# Patient Record
Sex: Male | Born: 1985 | Race: White | Hispanic: No | Marital: Married | Smoking: Never smoker
Health system: Southern US, Community
[De-identification: ages and names within clinical notes are randomized; demographics above are authoritative.]

## PROBLEM LIST (undated history)

## (undated) ENCOUNTER — Emergency Department (HOSPITAL_BASED_OUTPATIENT_CLINIC_OR_DEPARTMENT_OTHER): Admission: EM | Payer: BLUE CROSS/BLUE SHIELD | Source: Home / Self Care

## (undated) DIAGNOSIS — I10 Essential (primary) hypertension: Secondary | ICD-10-CM

## (undated) DIAGNOSIS — T783XXA Angioneurotic edema, initial encounter: Secondary | ICD-10-CM

## (undated) DIAGNOSIS — E559 Vitamin D deficiency, unspecified: Secondary | ICD-10-CM

## (undated) DIAGNOSIS — G4733 Obstructive sleep apnea (adult) (pediatric): Secondary | ICD-10-CM

## (undated) HISTORY — DX: Obstructive sleep apnea (adult) (pediatric): G47.33

## (undated) HISTORY — DX: Angioneurotic edema, initial encounter: T78.3XXA

## (undated) HISTORY — DX: Vitamin D deficiency, unspecified: E55.9

## (undated) HISTORY — DX: Essential (primary) hypertension: I10

---

## 2011-07-25 ENCOUNTER — Encounter (HOSPITAL_COMMUNITY): Payer: Self-pay

## 2011-07-25 ENCOUNTER — Emergency Department (HOSPITAL_COMMUNITY): Payer: 59

## 2011-07-25 ENCOUNTER — Emergency Department (HOSPITAL_COMMUNITY)
Admission: EM | Admit: 2011-07-25 | Discharge: 2011-07-25 | Disposition: A | Discharge status: Home / Self Care | Payer: 59 | Attending: Emergency Medicine | Admitting: Emergency Medicine

## 2011-07-25 DIAGNOSIS — R109 Unspecified abdominal pain: Secondary | ICD-10-CM | POA: Insufficient documentation

## 2011-07-25 DIAGNOSIS — R1110 Vomiting, unspecified: Secondary | ICD-10-CM | POA: Insufficient documentation

## 2011-07-25 DIAGNOSIS — N23 Unspecified renal colic: Secondary | ICD-10-CM

## 2011-07-25 LAB — URINALYSIS, ROUTINE W REFLEX MICROSCOPIC
Glucose, UA: NEGATIVE mg/dL
Leukocytes, UA: NEGATIVE
Protein, ur: 30 mg/dL — AB

## 2011-07-25 LAB — COMPREHENSIVE METABOLIC PANEL
Albumin: 4.5 g/dL (ref 3.5–5.2)
Alkaline Phosphatase: 48 U/L (ref 39–117)
BUN: 15 mg/dL (ref 6–23)
CO2: 26 mEq/L (ref 19–32)
Chloride: 105 mEq/L (ref 96–112)
GFR calc non Af Amer: 70 mL/min — ABNORMAL LOW (ref >90)
Potassium: 3.7 mEq/L (ref 3.5–5.1)
Total Bilirubin: 0.4 mg/dL (ref 0.3–1.2)

## 2011-07-25 LAB — CBC
Hemoglobin: 15.9 g/dL (ref 13.0–17.0)
MCHC: 35.2 g/dL (ref 30.0–36.0)
RBC: 5.51 MIL/uL (ref 4.22–5.81)
WBC: 15.1 10*3/uL — ABNORMAL HIGH (ref 4.0–10.5)

## 2011-07-25 LAB — DIFFERENTIAL
Basophils Relative: 0 % (ref 0–1)
Lymphocytes Relative: 13 % (ref 12–46)
Lymphs Abs: 2 10*3/uL (ref 0.7–4.0)
Monocytes Relative: 5 % (ref 3–12)
Neutro Abs: 12.4 10*3/uL — ABNORMAL HIGH (ref 1.7–7.7)
Neutrophils Relative %: 82 % — ABNORMAL HIGH (ref 43–77)

## 2011-07-25 LAB — POCT I-STAT, CHEM 8
BUN: 15 mg/dL (ref 6–23)
Calcium, Ion: 1.29 mmol/L (ref 1.12–1.32)
Creatinine, Ser: 1.5 mg/dL — ABNORMAL HIGH (ref 0.50–1.35)
TCO2: 27 mmol/L (ref 0–100)

## 2011-07-25 MED ORDER — OXYCODONE-ACETAMINOPHEN 5-325 MG PO TABS: 2.0000 | ORAL_TABLET | ORAL | Status: AC | PRN

## 2011-07-25 MED ORDER — ONDANSETRON HCL 4 MG/2ML IJ SOLN
4.0000 mg | Freq: Once | INTRAMUSCULAR | Status: AC
  Administered 2011-07-25: 4 mg via INTRAVENOUS
  Filled 2011-07-25: qty 2

## 2011-07-25 MED ORDER — CIPROFLOXACIN HCL 500 MG PO TABS: 500.0000 mg | ORAL_TABLET | Freq: Two times a day (BID) | ORAL | Status: AC

## 2011-07-25 MED ORDER — KETOROLAC TROMETHAMINE 30 MG/ML IJ SOLN
30.0000 mg | Freq: Once | INTRAMUSCULAR | Status: AC
  Administered 2011-07-25: 30 mg via INTRAVENOUS
  Filled 2011-07-25: qty 1

## 2011-07-25 MED ORDER — ONDANSETRON 8 MG PO TBDP: 8.0000 mg | ORAL_TABLET | Freq: Three times a day (TID) | ORAL | Status: AC | PRN

## 2011-07-25 MED ORDER — HYDROMORPHONE HCL PF 1 MG/ML IJ SOLN
1.0000 mg | Freq: Once | INTRAMUSCULAR | Status: AC
  Administered 2011-07-25: 1 mg via INTRAVENOUS
  Filled 2011-07-25: qty 1

## 2011-07-25 MED ORDER — OXYCODONE-ACETAMINOPHEN 5-325 MG PO TABS: 1.0000 | ORAL_TABLET | ORAL | Status: AC | PRN

## 2011-07-25 NOTE — ED Notes (Signed)
Pt in with n/v states onset 1 hr ago states right side abd pain with urinary retention denies hx of kidney stones denies pain radiating to groin

## 2011-07-25 NOTE — ED Provider Notes (Signed)
History     CSN: [id number]  Arrival date & time 07/25/11  1409   First MD Initiated Contact with Patient 07/25/11 1504      Chief Complaint  Patient presents with  . Abdominal Pain    (Consider location/radiation/quality/duration/timing/severity/associated sxs/prior treatment) HPI  Patient with sudden onset right flank pain just prior to arrival.  Feels need to urinate but unable.  Pain is severe and associated with vomiting.  Radiates to right groin.  No history of kidney stones.   History reviewed. No pertinent past medical history.  History reviewed. No pertinent past surgical history.  No family history on file.  History  Substance Use Topics  . Smoking status: Never Smoker   . Smokeless tobacco: Not on file  . Alcohol Use: Yes      Review of Systems  All other systems reviewed and are negative.    Allergies  Review of patient's allergies indicates no known allergies.  Home Medications  No current outpatient prescriptions on file.  BP 138/79  Pulse 93  Temp 98 F (36.7 C)  Resp 22  SpO2 99%  Physical Exam  Vitals reviewed. Constitutional: He is oriented to person, place, and time. He appears well-developed and well-nourished.  HENT:  Head: Normocephalic and atraumatic.  Eyes: Conjunctivae and EOM are normal. Pupils are equal, round, and reactive to light.  Neck: Normal range of motion.  Cardiovascular: Normal rate and regular rhythm.   Pulmonary/Chest: Effort normal and breath sounds normal.  Abdominal: Soft. Bowel sounds are normal. There is tenderness.  Musculoskeletal: Normal range of motion.  Neurological: He is alert and oriented to person, place, and time.  Skin: Skin is warm and dry.  Psychiatric: He has a normal mood and affect.    ED Course  Procedures (including critical care time)   Labs Reviewed  I-STAT, CHEM 8  CBC  DIFFERENTIAL  COMPREHENSIVE METABOLIC PANEL  URINALYSIS, ROUTINE W REFLEX MICROSCOPIC   No results  found.   No diagnosis found.    MDM  Patient with good pain control here.  CT results reviewed and reviewed with patient.  Cipro, percocet, and zofran prescribed.  Plan referral to urology.         Hilario Quarry, MD 07/25/11 [phone]

## 2013-05-02 ENCOUNTER — Encounter: Payer: Self-pay | Admitting: Emergency Medicine

## 2013-05-02 ENCOUNTER — Emergency Department (INDEPENDENT_AMBULATORY_CARE_PROVIDER_SITE_OTHER): Payer: 59

## 2013-05-02 ENCOUNTER — Emergency Department (INDEPENDENT_AMBULATORY_CARE_PROVIDER_SITE_OTHER)
Admission: EM | Admit: 2013-05-02 | Discharge: 2013-05-02 | Disposition: A | Discharge status: Home / Self Care | Payer: 59 | Source: Home / Self Care | Attending: Family Medicine | Admitting: Family Medicine

## 2013-05-02 DIAGNOSIS — M7750 Other enthesopathy of unspecified foot: Secondary | ICD-10-CM

## 2013-05-02 DIAGNOSIS — M7741 Metatarsalgia, right foot: Secondary | ICD-10-CM

## 2013-05-02 DIAGNOSIS — M79609 Pain in unspecified limb: Secondary | ICD-10-CM

## 2013-05-02 MED ORDER — MELOXICAM 15 MG PO TABS: 15.0000 mg | ORAL_TABLET | Freq: Every day | ORAL | Status: DC

## 2013-05-02 NOTE — ED Notes (Signed)
Pt c/o RT foot pain x 3-4 days. Denies injury, redness or swelling.

## 2013-05-02 NOTE — ED Provider Notes (Signed)
CSN: [id number]     Arrival date & time 05/02/13  0808 History   First MD Initiated Contact with Patient 05/02/13 [phone]     Chief Complaint  Patient presents with  . Foot Pain    HPI  Pt presents today with R foot pain x 3-4 days.  No known injury. Pt is not a runner.  Has had pain on plantar aspect/base of 3rd,4th and 5th toes Worse in am upon standing.  Does seem to improve over the course of the day though pain still present.  No heel/plantar pain.  Pt stands/walks on his feet >12 hours per day at work.   History reviewed. No pertinent past medical history. History reviewed. No pertinent past surgical history. Family History  Problem Relation Age of Onset  . Heart failure Father   . Gout Father    History  Substance Use Topics  . Smoking status: Never Smoker   . Smokeless tobacco: Not on file  . Alcohol Use: Yes    Review of Systems  All other systems reviewed and are negative.    Allergies  Review of patient's allergies indicates no known allergies.  Home Medications   Current Outpatient Rx  Name  Route  Sig  Dispense  Refill  . phentermine 37.5 MG capsule   Oral   Take 37.5 mg by mouth every morning.         . meloxicam (MOBIC) 15 MG tablet   Oral   Take 1 tablet (15 mg total) by mouth daily.   30 tablet   1    BP 116/77  Pulse 86  Temp(Src) 98.2 F (36.8 C) (Oral)  Resp 18  Wt 227 lb (102.967 kg)  SpO2 100% Physical Exam  Constitutional: He appears well-developed and well-nourished.  HENT:  Head: Normocephalic and atraumatic.  Eyes: Conjunctivae are normal. Pupils are equal, round, and reactive to light.  Neck: Normal range of motion.  Cardiovascular: Normal rate and regular rhythm.   Pulmonary/Chest: Effort normal and breath sounds normal.  Abdominal: Soft.  Musculoskeletal:  + TTP at plantar base of 3rd anf 4th metatarsal  Able ambulate  No swelling  Full ROM   Neurological: He is alert.  Skin: Skin is warm.    ED Course   Procedures (including critical care time) Labs Review Labs Reviewed - No data to display Imaging Review Dg Foot Complete Right  05/02/2013   CLINICAL DATA:  Right foot pain without injury.  EXAM: RIGHT FOOT COMPLETE - 3+ VIEW  COMPARISON:  None.  FINDINGS: There is no evidence of fracture or dislocation. There is no evidence of arthropathy or other focal bone abnormality. Soft tissues are unremarkable.  IMPRESSION: Normal right foot.   Electronically Signed   By: Roque Lias M.D.   On: 05/02/2013 08:49      MDM   1. Metatarsalgia, right    Exam consistent with metatarsalgia.  No fracture or dislocation on imaging.  Will place in post op shoe with metatarsal pad RICE and NSAIDs Follow up with sports medicine if sxs not improved in 1-2 weeks.     The patient and/or caregiver has been counseled thoroughly with regard to treatment plan and/or medications prescribed including dosage, schedule, interactions, rationale for use, and possible side effects and they verbalize understanding. Diagnoses and expected course of recovery discussed and will return if not improved as expected or if the condition worsens. Patient and/or caregiver verbalized understanding.         Doree Albee,  MD 05/02/13 [phone]

## 2013-07-19 ENCOUNTER — Encounter: Payer: Self-pay | Admitting: Emergency Medicine

## 2013-07-19 ENCOUNTER — Emergency Department (INDEPENDENT_AMBULATORY_CARE_PROVIDER_SITE_OTHER)
Admission: EM | Admit: 2013-07-19 | Discharge: 2013-07-19 | Disposition: A | Discharge status: Home / Self Care | Payer: 59 | Source: Home / Self Care | Attending: Family Medicine | Admitting: Family Medicine

## 2013-07-19 DIAGNOSIS — R69 Illness, unspecified: Principal | ICD-10-CM

## 2013-07-19 DIAGNOSIS — J111 Influenza due to unidentified influenza virus with other respiratory manifestations: Secondary | ICD-10-CM

## 2013-07-19 MED ORDER — GUAIFENESIN-CODEINE 100-10 MG/5ML PO SOLN: ORAL | Status: DC

## 2013-07-19 MED ORDER — OSELTAMIVIR PHOSPHATE 75 MG PO CAPS: 75.0000 mg | ORAL_CAPSULE | Freq: Two times a day (BID) | ORAL | Status: DC

## 2013-07-19 NOTE — Discharge Instructions (Signed)
Take plain Mucinex (1200 mg guaifenesin) twice daily for cough and congestion.  May add Sudafed for sinus congestion.  Increase fluid intake, rest. °May use Afrin nasal spray (or generic oxymetazoline) twice daily for about 5 days.  Also recommend using saline nasal spray several times daily and saline nasal irrigation (AYR is a common brand) °Try warm salt water gargles for sore throat.  °Stop all antihistamines for now, and other non-prescription cough/cold preparations. °May take Ibuprofen 200mg, 4 tabs every 8 hours with food for chest/sternum discomfort. ° ° °Influenza, Adult °Influenza ("the flu") is a viral infection of the respiratory tract. It occurs more often in winter months because people spend more time in close contact with one another. Influenza can make you feel very sick. Influenza easily spreads from person to person (contagious). °CAUSES  °Influenza is caused by a virus that infects the respiratory tract. You can catch the virus by breathing in droplets from an infected person's cough or sneeze. You can also catch the virus by touching something that was recently contaminated with the virus and then touching your mouth, nose, or eyes. °SYMPTOMS  °Symptoms typically last 4 to 10 days and may include: °· Fever. °· Chills. °· Headache, body aches, and muscle aches. °· Sore throat. °· Chest discomfort and cough. °· Poor appetite. °· Weakness or feeling tired. °· Dizziness. °· Nausea or vomiting. °DIAGNOSIS  °Diagnosis of influenza is often made based on your history and a physical exam. A nose or throat swab test can be done to confirm the diagnosis. °RISKS AND COMPLICATIONS °You may be at risk for a more severe case of influenza if you smoke cigarettes, have diabetes, have chronic heart disease (such as heart failure) or lung disease (such as asthma), or if you have a weakened immune system. Elderly people and pregnant women are also at risk for more serious infections. The most common complication  of influenza is a lung infection (pneumonia). Sometimes, this complication can require emergency medical care and may be life-threatening. °PREVENTION  °An annual influenza vaccination (flu shot) is the best way to avoid getting influenza. An annual flu shot is now routinely recommended for all adults in the [place] °TREATMENT  °In mild cases, influenza goes away on its own. Treatment is directed at relieving symptoms. For more severe cases, your caregiver may prescribe antiviral medicines to shorten the sickness. Antibiotic medicines are not effective, because the infection is caused by a virus, not by bacteria. °HOME CARE INSTRUCTIONS °· Only take over-the-counter or prescription medicines for pain, discomfort, or fever as directed by your caregiver. °· Use a cool mist humidifier to make breathing easier. °· Get plenty of rest until your temperature returns to normal. This usually takes 3 to 4 days. °· Drink enough fluids to keep your urine clear or pale yellow. °· Cover your mouth and nose when coughing or sneezing, and wash your hands well to avoid spreading the virus. °· Stay home from work or school until your fever has been gone for at least 1 full day. °SEEK MEDICAL CARE IF:  °· You have chest pain or a deep cough that worsens or produces more mucus. °· You have nausea, vomiting, or diarrhea. °SEEK IMMEDIATE MEDICAL CARE IF:  °· You have difficulty breathing, shortness of breath, or your skin or nails turn bluish. °· You have severe neck pain or stiffness. °· You have a severe headache, facial pain, or earache. °· You have a worsening or recurring fever. °· You have nausea or   vomiting that cannot be controlled. °MAKE SURE YOU: °· Understand these instructions. °· Will watch your condition. °· Will get help right away if you are not doing well or get worse. °Document Released: 06/19/2000 Document Revised: 12/22/2011 Document Reviewed: 09/21/2011 °[employer]® Patient Information ©2014 [employer]. ° ° °   °

## 2013-07-19 NOTE — ED Provider Notes (Signed)
CSN: [id number]     Arrival date & time 07/19/13  1334 History   First MD Initiated Contact with Patient 07/19/13 1408     Chief Complaint  Patient presents with  . Weakness  . Nasal Congestion      HPI Comments: Patient developed mild nasal congestion about 3 days ago but did not feel ill.  About 3 hours ago he suddenly developed fatigue, weakness, myalgias, low grade fever, headache, and a productive cough.  No sore throat.  The history is provided by the patient.    History reviewed. No pertinent past medical history. History reviewed. No pertinent past surgical history. Family History  Problem Relation Age of Onset  . Heart failure Father   . Gout Father    History  Substance Use Topics  . Smoking status: Never Smoker   . Smokeless tobacco: Never Used  . Alcohol Use: Yes    Review of Systems No sore throat + cough No pleuritic pain No wheezing + nasal congestion + post-nasal drainage No sinus pain/pressure No itchy/red eyes No earache No hemoptysis No SOB + low grade fever, + chills No nausea No vomiting No abdominal pain No diarrhea No urinary symptoms No skin rash + fatigue + myalgias + headache Used OTC meds without relief  Allergies  Review of patient's allergies indicates no known allergies.  Home Medications   Current Outpatient Rx  Name  Route  Sig  Dispense  Refill  . guaiFENesin-codeine 100-10 MG/5ML syrup      Take 10mL by mouth at bedtime as needed for cough   120 mL   0   . oseltamivir (TAMIFLU) 75 MG capsule   Oral   Take 1 capsule (75 mg total) by mouth every 12 (twelve) hours.   10 capsule   0    BP 128/83  Pulse 101  Temp(Src) 99.1 F (37.3 C) (Oral)  Wt 232 lb (105.235 kg)  SpO2 99% Physical Exam Nursing notes and Vital Signs reviewed. Appearance:  Patient appears stated age, and in no acute distress.  Patient is obese Eyes:  Pupils are equal, round, and reactive to light and accomodation.  Extraocular movement is  intact.  Conjunctivae are not inflamed  Ears:  Canals normal.  Tympanic membranes normal.  Nose:  Mildly congested turbinates.  No sinus tenderness.   Pharynx:  Normal Neck:  Supple.  Slightly tender shotty posterior nodes are palpated bilaterally  Lungs:  Clear to auscultation.  Breath sounds are equal.  Heart:  Regular rate and rhythm without murmurs, rubs, or gallops.  Abdomen:  Nontender without masses or hepatosplenomegaly.  Bowel sounds are present.  No CVA or flank tenderness.  Extremities:  No edema.  No calf tenderness Skin:  No rash present.   ED Course  Procedures  none      MDM   1. Influenza-like illness    Begin Tamiflu.  Robitussin AC at bedtime. Take plain Mucinex (1200 mg guaifenesin) twice daily for cough and congestion.  May add Sudafed for sinus congestion.   Increase fluid intake, rest. May use Afrin nasal spray (or generic oxymetazoline) twice daily for about 5 days.  Also recommend using saline nasal spray several times daily and saline nasal irrigation (AYR is a common brand) Try warm salt water gargles for sore throat.  Stop all antihistamines for now, and other non-prescription cough/cold preparations. May take Ibuprofen 200mg , 4 tabs every 8 hours with food for chest/sternum discomfort Followup with Family Doctor if not improved in one  week.     Lattie HawStephen A Nashya Garlington, MD 07/20/13 1149

## 2013-07-19 NOTE — ED Notes (Signed)
[name] c/o congestion, productive cough x 2-3 days. Today he developed quick onset of fatigue, weakness, and body aches. NO flu vac this season, co-worker with influenza.

## 2015-08-04 ENCOUNTER — Ambulatory Visit (HOSPITAL_BASED_OUTPATIENT_CLINIC_OR_DEPARTMENT_OTHER)
Admission: RE | Admit: 2015-08-04 | Discharge: 2015-08-04 | Disposition: A | Discharge status: Home / Self Care | Payer: BLUE CROSS/BLUE SHIELD | Source: Ambulatory Visit | Attending: Emergency Medicine | Admitting: Emergency Medicine

## 2015-08-04 ENCOUNTER — Emergency Department (INDEPENDENT_AMBULATORY_CARE_PROVIDER_SITE_OTHER)
Admission: EM | Admit: 2015-08-04 | Discharge: 2015-08-04 | Disposition: A | Discharge status: Other Acute Inpatient Hospital | Payer: BLUE CROSS/BLUE SHIELD | Source: Home / Self Care | Attending: Family Medicine | Admitting: Family Medicine

## 2015-08-04 ENCOUNTER — Encounter: Payer: Self-pay | Admitting: Emergency Medicine

## 2015-08-04 DIAGNOSIS — M79651 Pain in right thigh: Secondary | ICD-10-CM | POA: Diagnosis not present

## 2015-08-04 DIAGNOSIS — M79604 Pain in right leg: Secondary | ICD-10-CM | POA: Diagnosis not present

## 2015-08-04 MED ORDER — HYDROCODONE-ACETAMINOPHEN 5-325 MG PO TABS: 1.0000 | ORAL_TABLET | Freq: Four times a day (QID) | ORAL | Status: DC | PRN

## 2015-08-04 MED ORDER — CYCLOBENZAPRINE HCL 10 MG PO TABS: 10.0000 mg | ORAL_TABLET | Freq: Two times a day (BID) | ORAL | Status: DC | PRN

## 2015-08-04 NOTE — Discharge Instructions (Signed)
Flexeril is a muscle relaxer and may cause drowsiness. Do not drink alcohol, drive, or operate heavy machinery while taking.  Norco/vicodin (hydrocodone-acetaminophen) is a narcotic pain medication, do not combine these medications with others containing tylenol. While taking, do not drink alcohol, drive, or perform any other activities that requires focus while taking these medications.

## 2015-08-04 NOTE — ED Provider Notes (Signed)
CSN: [id number]     Arrival date & time 08/04/15  1704 History   First MD Initiated Contact with Patient 08/04/15 1705     Chief Complaint  Patient presents with  . Leg Pain   (Consider location/radiation/quality/duration/timing/severity/associated sxs/prior Treatment) HPI  Pt is a 30yo male presenting to [facility] with c/o 1 week long hx of Right upper thigh pain. Pain is 4/10, worse with ambulation and any Right leg movement.  Pain is aching and sore.  Temporary relief with ibuprofen but no known injury. Denies abdominal pain. Denies hx of hernias. Denies redness, swelling, or bruising of area. Denies urinary symptoms. Denies hx of blood clots.    History reviewed. No pertinent past medical history. History reviewed. No pertinent past surgical history. Family History  Problem Relation Age of Onset  . Heart failure Father   . Gout Father    Social History  Substance Use Topics  . Smoking status: Never Smoker   . Smokeless tobacco: Never Used  . Alcohol Use: Yes    Review of Systems  Musculoskeletal: Positive for myalgias. Negative for back pain, joint swelling and arthralgias.       Right anterior thigh  Skin: Negative for color change and wound.  Neurological: Negative for weakness and numbness.    Allergies  Review of patient's allergies indicates no known allergies.  Home Medications   Prior to Admission medications   Medication Sig Start Date End Date Taking? Authorizing Provider  cyclobenzaprine (FLEXERIL) 10 MG tablet Take 1 tablet (10 mg total) by mouth 2 (two) times daily as needed for muscle spasms. 08/04/15   Junius Finner, PA-C  guaiFENesin-codeine 100-10 MG/5ML syrup Take 10mL by mouth at bedtime as needed for cough 07/19/13   Lattie Haw, MD  HYDROcodone-acetaminophen (NORCO/VICODIN) 5-325 MG tablet Take 1 tablet by mouth every 6 (six) hours as needed for moderate pain or severe pain. 08/04/15   Junius Finner, PA-C  oseltamivir (TAMIFLU) 75 MG capsule Take 1 capsule  (75 mg total) by mouth every 12 (twelve) hours. 07/19/13   Lattie Haw, MD   Meds Ordered and Administered this Visit  Medications - No data to display  Pulse 73  Temp(Src) 98.4 F (36.9 C) (Oral)  Resp 16  Ht  (1.88 m)  Wt 257 lb 8 oz (116.801 kg)  BMI 33.05 kg/m2  SpO2 97% No data found.   Physical Exam  Constitutional: He is oriented to person, place, and time. He appears well-developed and well-nourished.  HENT:  Head: Normocephalic and atraumatic.  Eyes: EOM are normal.  Neck: Normal range of motion.  Cardiovascular: Normal rate.   Pulmonary/Chest: Effort normal.  Musculoskeletal: Normal range of motion. He exhibits tenderness. He exhibits no edema.  Right thigh: no obvious edema. No mass palpated. Mild tenderness to anterior medial thigh.  Increased pain with straight leg raise (pain in anterior thigh) Calf is soft, non-tender. Antalgic gait.   Neurological: He is alert and oriented to person, place, and time.  Skin: Skin is warm and dry. No rash noted. No erythema.  Right thigh: no erythema, ecchymosis, or warmth.  Psychiatric: He has a normal mood and affect. His behavior is normal.  Nursing note and vitals reviewed.   ED Course  Procedures (including critical care time)  Labs Review Labs Reviewed - No data to display  Imaging Review US Venous Img Lower Unilateral Right  08/04/2015  CLINICAL DATA:  30 year old male with acute right leg pain for 1 week. EXAM: RIGHT LOWER  EXTREMITY VENOUS DOPPLER ULTRASOUND TECHNIQUE: Gray-scale sonography with graded compression, as well as color Doppler and duplex ultrasound were performed to evaluate the lower extremity deep venous systems from the level of the common femoral vein and including the common femoral, femoral, profunda femoral, popliteal and calf veins including the posterior tibial, peroneal and gastrocnemius veins when visible. The superficial great saphenous vein was also interrogated. Spectral Doppler was  utilized to evaluate flow at rest and with distal augmentation maneuvers in the common femoral, femoral and popliteal veins. COMPARISON:  None. FINDINGS: Deep venous system appears patent and compressible from groin through popliteal fossae. Spontaneous venous flow present with evidence of respiratory phasicity. Augmentation intact. No intraluminal thrombus identified. Visualized portions of the greater saphenous veins patent. The left common femoral vein is patent without DVT. IMPRESSION: No evidence of right lower extremity deep venous thrombosis. Electronically Signed   By: Harmon Pier M.D.   On: 08/04/2015 18:49       MDM   1. Right thigh pain     Pt c/o Right anterior-medial thigh pain that started 1 week ago.  No known injury. Only temporary relief with ibuprofen.  Tender on exam and with ROM w/o palpable hernias or masses.  Pt is low risk for DVT, however, with no known cause of symptoms, will order U/S  Pt sent to [facility] for imaging and will be notified of results. Pt prescribed norco and flexeril for muscular pain. Encouraged to f/u with Dr. Denyse Amass if symptoms not improving in 3-4 days.   U/S negative. Pt given results over the phone and reassured no blood clot.  Junius Finner, PA-C 08/06/15 0800

## 2015-08-04 NOTE — ED Notes (Signed)
Patient presents to [facility] with C/O pain in the inner upper right leg. Rates pain 4/10 sharp in nature, pain times 1 week denies injury.

## 2015-08-06 ENCOUNTER — Ambulatory Visit (INDEPENDENT_AMBULATORY_CARE_PROVIDER_SITE_OTHER): Payer: BLUE CROSS/BLUE SHIELD

## 2015-08-06 ENCOUNTER — Encounter: Payer: Self-pay | Admitting: Family Medicine

## 2015-08-06 ENCOUNTER — Ambulatory Visit (INDEPENDENT_AMBULATORY_CARE_PROVIDER_SITE_OTHER): Payer: BLUE CROSS/BLUE SHIELD | Admitting: Family Medicine

## 2015-08-06 VITALS — BP 142/83 | HR 67 | Ht 74.0 in | Wt 254.0 lb

## 2015-08-06 DIAGNOSIS — R1030 Lower abdominal pain, unspecified: Secondary | ICD-10-CM

## 2015-08-06 DIAGNOSIS — R1031 Right lower quadrant pain: Secondary | ICD-10-CM

## 2015-08-06 LAB — URIC ACID: URIC ACID, SERUM: 7.2 mg/dL (ref 4.0–7.8)

## 2015-08-06 LAB — COMPREHENSIVE METABOLIC PANEL
ALK PHOS: 44 U/L (ref 40–115)
ALT: 19 U/L (ref 9–46)
AST: 18 U/L (ref 10–40)
Albumin: 4.1 g/dL (ref 3.6–5.1)
BUN: 15 mg/dL (ref 7–25)
CALCIUM: 9.5 mg/dL (ref 8.6–10.3)
CHLORIDE: 105 mmol/L (ref 98–110)
CO2: 26 mmol/L (ref 20–31)
Creat: 1.12 mg/dL (ref 0.60–1.35)
GLUCOSE: 73 mg/dL (ref 65–99)
POTASSIUM: 3.9 mmol/L (ref 3.5–5.3)
Sodium: 141 mmol/L (ref 135–146)
TOTAL PROTEIN: 7.4 g/dL (ref 6.1–8.1)
Total Bilirubin: 0.5 mg/dL (ref 0.2–1.2)

## 2015-08-06 LAB — C-REACTIVE PROTEIN

## 2015-08-06 MED ORDER — COLCHICINE 0.6 MG PO TABS: 0.6000 mg | ORAL_TABLET | Freq: Every day | ORAL | Status: DC

## 2015-08-06 MED ORDER — HYDROCODONE-ACETAMINOPHEN 5-325 MG PO TABS: 1.0000 | ORAL_TABLET | Freq: Four times a day (QID) | ORAL | Status: DC | PRN

## 2015-08-06 NOTE — Progress Notes (Signed)
Subjective:    I'm seeing this patient as a consultation for: Junius Finner [facility]  CC: R superior medial thigh pain  HPI: Patient has a 10 day history of sudden onset, progressively worsening R superior medial thigh pain that has progressed to anterior and lateral thigh pain in the last 2-3 days. It is 8/10 when exacerbated by walking, going down stairs, internal rotation and abduction. He had a particularly difficult time tying his shoes this morning and had to get his wife to do it. It is relieved with rest. Ibuprofen, vicodin and flexeril make it better for an hour or so. He went to urgent care 2 days prior and was given the pain medication and muscle relaxer. He was sent to the ER for doppler studies which were negative. He has afamily history notable for gout in his dad. He is not on any other medications and has no comorbid conditions. He works at a desk job. He denies recent travel, smoking, recent increase in physical activity or injury to explain the pain, increased in alcohol or steak intake, fevers, dirarrheal or URI illness, dysuria or penile discharge.   Past medical history, Surgical history, Family history not pertinant except as noted below, Social history, Allergies, and medications have been entered into the medical record, reviewed, and no changes needed.   Review of Systems: No headache, visual changes, nausea, vomiting, diarrhea, constipation, dizziness, abdominal pain, skin rash, fevers, chills, night sweats, weight loss, swollen lymph nodes, body aches, joint swelling, muscle aches, chest pain, shortness of breath, mood changes, visual or auditory hallucinations.   Objective:    Filed Vitals:   08/06/15 0831  BP: 142/83  Pulse: 67   General: Well Developed, well nourished, and in no acute distress.  Neuro/Psych: Alert and oriented x3, extra-ocular muscles intact, able to move all 4 extremities, sensation grossly intact. Skin: Warm and dry, no rashes noted.  Respiratory:  Not using accessory muscles, speaking in full sentences, trachea midline.  Cardiovascular: Pulses palpable, no extremity edema. Abdomen: Does not appear distended. MSK:  No visual or palpable deformity and nontender in the medial, anterior and lateral superior thigh  Pain reproduced with abduction, internal and external rotation and flexion. No pain in other planes of movement, bilateral 5/5 strength Nontender to palpation. Painful gait  No results found for this or any previous visit (from the past 24 hour(s)). US Venous Img Lower Unilateral Right  08/04/2015  CLINICAL DATA:  30 year old male with acute right leg pain for 1 week. EXAM: RIGHT LOWER EXTREMITY VENOUS DOPPLER ULTRASOUND TECHNIQUE: Gray-scale sonography with graded compression, as well as color Doppler and duplex ultrasound were performed to evaluate the lower extremity deep venous systems from the level of the common femoral vein and including the common femoral, femoral, profunda femoral, popliteal and calf veins including the posterior tibial, peroneal and gastrocnemius veins when visible. The superficial great saphenous vein was also interrogated. Spectral Doppler was utilized to evaluate flow at rest and with distal augmentation maneuvers in the common femoral, femoral and popliteal veins. COMPARISON:  None. FINDINGS: Deep venous system appears patent and compressible from groin through popliteal fossae. Spontaneous venous flow present with evidence of respiratory phasicity. Augmentation intact. No intraluminal thrombus identified. Visualized portions of the greater saphenous veins patent. The left common femoral vein is patent without DVT. IMPRESSION: No evidence of right lower extremity deep venous thrombosis. Electronically Signed   By: Harmon Pier M.D.   On: 08/04/2015 18:49   Dg Hip Unilat With Pelvis  2-3 Views Right  08/06/2015  CLINICAL DATA:  Ten day history of abrupt onset right superior medial thigh pain which now extends  into the anterior lateral 5 ; symptoms are worsened with walking or going down stairs common no known injury. EXAM: DG HIP (WITH OR WITHOUT PELVIS) 2-3V RIGHT COMPARISON:  CT scan of the abdomen and pelvis of July 25, 2011 FINDINGS: The bony pelvis is adequately mineralized. There is no lytic or blastic lesion nor acute or healing fracture. The sacrum and SI joints are normal. The hip joint spaces exhibit mild narrowing bilaterally. The articular surfaces of the femoral heads remain smoothly rounded. AP and frog-leg lateral views of the right hip reveal no bony abnormality. The femoral neck, intertrochanteric, and subtrochanteric regions are normal. The overlying soft tissues are grossly normal as well. IMPRESSION: There is mild symmetric narrowing of the hip joints bilaterally. There is no acute bony abnormality of the right hip nor of the bony pelvis. Electronically Signed   By: David  [place] M.D.   On: 08/06/2015 09:14    Impression and Recommendations:   This case required medical decision making of moderate complexity.

## 2015-08-06 NOTE — Patient Instructions (Signed)
Thank you for coming in today. You were seen for your right hip pain. Your XRays were negative for fracture, arthritis or deformity you were born with. This is most likely due to Gout. We will prescribe two forms of colchcine for that. The options are Colcrys and Mitigare, ask the pharamacist which on ewill be cheaper with your insurance. We will prescribe medication for pain, use sparingly at work. Please return in 1 week or sooner for worsening pain. We will consider injection, withdrawing fluid for analysis and further imaging at that time.

## 2015-08-06 NOTE — Progress Notes (Signed)
Quick Note:  Xray shows mild arthritis ______ 

## 2015-08-06 NOTE — Assessment & Plan Note (Addendum)
Acute. Given negative hip XR for fracture, severe OA and congenital deformity, this is most likely due to acute gouty arthritis given family history, monoarticular joint pain with lack of risk factors for alternative causes such as septic arthritis, reactivesarthritis or gonoccoccal arthritis. We will treat with colchicine and follow up in one week. Will get labs to evaluate for rheumatalogic causes, serum UA level to further support diagnosis. Next steps consider arthrocentesis with synovial fluid analysis, lidocaine hip injection and/or MRI arthrogram if injection relieves pain.

## 2015-08-07 ENCOUNTER — Telehealth: Payer: Self-pay

## 2015-08-07 LAB — ANTI-NUCLEAR AB-TITER (ANA TITER): ANA Titer 1: 1:80 {titer} — ABNORMAL HIGH

## 2015-08-07 LAB — CBC
HCT: 44.1 % (ref 39.0–52.0)
HEMOGLOBIN: 15.1 g/dL (ref 13.0–17.0)
MCH: 28.8 pg (ref 26.0–34.0)
MCHC: 34.2 g/dL (ref 30.0–36.0)
MCV: 84.2 fL (ref 78.0–100.0)
MPV: 10.2 fL (ref 8.6–12.4)
PLATELETS: 165 10*3/uL (ref 150–400)
RBC: 5.24 MIL/uL (ref 4.22–5.81)
RDW: 13.4 % (ref 11.5–15.5)
WBC: 6.8 10*3/uL (ref 4.0–10.5)

## 2015-08-07 LAB — ANA: Anti Nuclear Antibody(ANA): POSITIVE — AB

## 2015-08-07 LAB — SEDIMENTATION RATE: SED RATE: 6 mm/h (ref 0–15)

## 2015-08-07 NOTE — Progress Notes (Signed)
Quick Note:  1) Uric acid level is mild elevated. This could indicate gout.  2) Labs are somewhat concerning for lupus. This may cause joint pain. We will follow up soon and consider referral to rheumatology.   ______

## 2015-08-08 ENCOUNTER — Encounter: Payer: Self-pay | Admitting: Family Medicine

## 2015-08-08 ENCOUNTER — Ambulatory Visit (INDEPENDENT_AMBULATORY_CARE_PROVIDER_SITE_OTHER): Payer: BLUE CROSS/BLUE SHIELD | Admitting: Family Medicine

## 2015-08-08 VITALS — BP 131/89 | HR 126 | Wt 251.0 lb

## 2015-08-08 DIAGNOSIS — M25561 Pain in right knee: Secondary | ICD-10-CM

## 2015-08-08 DIAGNOSIS — R768 Other specified abnormal immunological findings in serum: Secondary | ICD-10-CM

## 2015-08-08 DIAGNOSIS — R1030 Lower abdominal pain, unspecified: Secondary | ICD-10-CM | POA: Diagnosis not present

## 2015-08-08 DIAGNOSIS — R1031 Right lower quadrant pain: Secondary | ICD-10-CM

## 2015-08-08 MED ORDER — PREDNISONE 10 MG (48) PO TBPK: ORAL_TABLET | Freq: Every day | ORAL | Status: DC

## 2015-08-08 MED ORDER — HYDROCODONE-ACETAMINOPHEN 5-325 MG PO TABS: 1.0000 | ORAL_TABLET | ORAL | Status: DC | PRN

## 2015-08-08 NOTE — Progress Notes (Signed)
Dillon Robinson is a 30 y.o. male who presents to [facility] today for R hip and knee pain.  Patient seen two days ago for groin pain though to be from the hip. Patient treated with colchicine, Vicodin and Flexeril to minimal relief. Vicodin lasts 2 hours, 4/10 on medication and 8/10 after 2 hours. Yesterday afternoon, he began to have R inferior thigh pain and knee pain. Patient presents today given little to no progress. He worked yesterday but was miserable with pain. He has not had any fevers or any new symptoms.    No past medical history on file. No past surgical history on file. Social History  Substance Use Topics  . Smoking status: Never Smoker   . Smokeless tobacco: Never Used  . Alcohol Use: Yes   family history includes Gout in his father; Heart failure in his father.  ROS as above Medications: Current Outpatient Prescriptions  Medication Sig Dispense Refill  . cyclobenzaprine (FLEXERIL) 10 MG tablet Take 1 tablet (10 mg total) by mouth 2 (two) times daily as needed for muscle spasms. 20 tablet 0  . HYDROcodone-acetaminophen (NORCO/VICODIN) 5-325 MG tablet Take 1-2 tablets by mouth every 4 (four) hours as needed for moderate pain or severe pain. 45 tablet 0  . predniSONE (STERAPRED UNI-PAK 48 TAB) 10 MG (48) TBPK tablet Take by mouth daily. 12 day dosepack po 48 tablet 0   No current facility-administered medications for this visit.   No Known Allergies   Exam:  BP 131/89 mmHg  Pulse 126  Wt 251 lb (113.853 kg) Gen: Uncomfortable appearing gentleman in NAD HEENT: EOMI,  MMM Lungs: Normal work of breathing. CTABL Heart: tachy no MRG Abd: NABS, Soft. Nondistended, Nontender Exts: Brisk capillary refill, warm and well perfused.  MSK: R hip No visual or palpable deformity  ROM limited with abduction Strength testing 5/5 in all planes bilaterally, pain reproduced  with abduction, external and internal rotation   R knee No visual or palpable deformity ROM 120 degrees with extension, full flexion Strength 5/5 bilaterally  Negative varus/valgus testing and anterior/posterior drawer Unable to perform mcmurrays due to pain with thigh internal rotation   Results for orders placed or performed in visit on 08/06/15 (from the past 72 hour(s))  CBC     Status: None   Collection Time: 08/06/15  9:39 AM  Result Value Ref Range   WBC 6.8 4.0 - 10.5 K/uL   RBC 5.24 4.22 - 5.81 MIL/uL   Hemoglobin 15.1 13.0 - 17.0 g/dL   HCT 16.1 09.6 - 04.5 %   MCV 84.2 78.0 - 100.0 fL   MCH 28.8 26.0 - 34.0 pg   MCHC 34.2 30.0 - 36.0 g/dL   RDW 40.9 81.1 - 91.4 %   Platelets 165 150 - 400 K/uL   MPV 10.2 8.6 - 12.4 fL  Comprehensive metabolic panel     Status: None   Collection Time: 08/06/15  9:39 AM  Result Value Ref Range   Sodium 141 135 - 146 mmol/L   Potassium 3.9 3.5 - 5.3 mmol/L   Chloride 105 98 - 110 mmol/L   CO2 26 20 - 31 mmol/L   Glucose, Bld 73 65 - 99 mg/dL   BUN 15 7 - 25 mg/dL   Creat 7.82 9.56 - 2.13 mg/dL   Total Bilirubin 0.5 0.2 - 1.2 mg/dL   Alkaline Phosphatase 44 40 - 115 U/L   AST 18 10 - 40  U/L   ALT 19 9 - 46 U/L   Total Protein 7.4 6.1 - 8.1 g/dL   Albumin 4.1 3.6 - 5.1 g/dL   Calcium 9.5 8.6 - 16.1 mg/dL  Sedimentation rate     Status: None   Collection Time: 08/06/15  9:39 AM  Result Value Ref Range   Sed Rate 6 0 - 15 mm/hr  ANA     Status: Abnormal   Collection Time: 08/06/15  9:39 AM  Result Value Ref Range   Anit Nuclear Antibody(ANA) POS (A) NEGATIVE  C-reactive protein     Status: None   Collection Time: 08/06/15  9:39 AM  Result Value Ref Range   CRP <0.5 <0.60 mg/dL  Uric acid     Status: None   Collection Time: 08/06/15  9:39 AM  Result Value Ref Range   Uric Acid, Serum 7.2 4.0 - 7.8 mg/dL  Anti-nuclear ab-titer (ANA titer)     Status: Abnormal   Collection Time: 08/06/15  9:39 AM  Result Value Ref Range    ANA Pattern 1 HOMOGENEOUS    ANA Titer 1 1:80 (H) titer    Comment:           Reference Range           < 1:40      Negative             1:40-1:80 Low Antibody level           > 1:80      Elevated Antibody level       No results found for this or any previous visit (from the past 24 hour(s)). No results found.  Before discharging, patient had a brief episode of tachycardia to the 130s with clamminess and dyspnea, good capillary refill, clear lungs.  Patient given water, vitals re-checked and the issue self-resolved. Patient instructed to return if symptoms return and/or get worse.  Please see individual assessment and plan sections.

## 2015-08-08 NOTE — Assessment & Plan Note (Signed)
Unknown etiology, will get labs as above and refer to rheumatology.

## 2015-08-08 NOTE — Assessment & Plan Note (Signed)
Likely related to hip pathology, possible rheumatologic cause given ANA postiive and no improvement with colchicine. FOllow up Monday and testing as above.

## 2015-08-08 NOTE — Assessment & Plan Note (Addendum)
Likely not gout given failure of response to colchicine and no change in pain. Likely hip joint pathology secondary to rheumatological cause given ANA positive. Will refer to rheumatology. Patient will be treated with prednisone and pain medication. Given crutches for support. Will test for GC/Chlamydia and a variety of rheumatologic labs. Next step is intra-articular injection and MRI. Follow up Monday.

## 2015-08-08 NOTE — Patient Instructions (Signed)
Thank you for coming in today. You were seen for the R hip pain. Given no improvement, this is likely not gout. Your ANA was positive suggesting rheumatologic disease. We will get further blood testing and refer you to referral.  We will treat with steroids. For pain take 1.5-2 tablets of Vicodin every 6 hours or 1 tablet every 4 hours.  Follow up on Monday.

## 2015-08-09 LAB — RHEUMATOID FACTOR: Rhuematoid fact SerPl-aCnc: <10 IU/mL (ref <=14)

## 2015-08-09 LAB — CYCLIC CITRUL PEPTIDE ANTIBODY, IGG: Cyclic Citrullin Peptide Ab: <16 Units

## 2015-08-09 LAB — CK: CK TOTAL: 77 U/L (ref 7–232)

## 2015-08-09 NOTE — Telephone Encounter (Signed)
You can take up to 2 aleve twice daily for pain

## 2015-08-09 NOTE — Telephone Encounter (Signed)
Pt.notified

## 2015-08-10 LAB — GC/CHLAMYDIA PROBE AMP
CT PROBE, AMP APTIMA: NOT DETECTED
GC PROBE AMP APTIMA: NOT DETECTED

## 2015-08-11 LAB — RFX PTT-LA W/RFX TO HEX PHASE CONF: PTT-LA SCREEN: 36 s (ref <=40)

## 2015-08-11 LAB — RFX DRVVT SCR W/RFLX CONF 1:1 MIX: DRVVT SCREEN: 41 s (ref <=45)

## 2015-08-11 LAB — LUPUS ANTICOAGULANT PANEL

## 2015-08-12 ENCOUNTER — Ambulatory Visit: Payer: BLUE CROSS/BLUE SHIELD | Admitting: Family Medicine

## 2015-08-15 LAB — HLA-B27 ANTIGEN: DNA Result:: NEGATIVE

## 2015-08-16 NOTE — Progress Notes (Signed)
Quick Note:  All other rheumatologic labs are negative. How is Dillon Robinson feeling? ______

## 2015-08-26 ENCOUNTER — Encounter: Payer: Self-pay | Admitting: Family Medicine

## 2015-08-26 ENCOUNTER — Ambulatory Visit (INDEPENDENT_AMBULATORY_CARE_PROVIDER_SITE_OTHER): Payer: BLUE CROSS/BLUE SHIELD | Admitting: Family Medicine

## 2015-08-26 VITALS — BP 140/80 | HR 79 | Wt 258.0 lb

## 2015-08-26 DIAGNOSIS — R1030 Lower abdominal pain, unspecified: Secondary | ICD-10-CM

## 2015-08-26 DIAGNOSIS — R1031 Right lower quadrant pain: Secondary | ICD-10-CM

## 2015-08-26 DIAGNOSIS — M25561 Pain in right knee: Secondary | ICD-10-CM | POA: Diagnosis not present

## 2015-08-26 MED ORDER — PREDNISONE 10 MG (48) PO TBPK: ORAL_TABLET | Freq: Every day | ORAL | Status: DC

## 2015-08-26 NOTE — Progress Notes (Signed)
   Dillon Robinson is a 30 y.o. male who presents to [facility] today for follow-up hip pain. Patient was seen last month for hip pain thought to be due to gout or some other rheumatologic condition. He had no improvement with colchicine but dramatic significant improvement with prednisone. As part of the workup his rheumatologic panel was positive for a positive ANA. With rheumatology tomorrow. He notes he is much better now that since prednisone dose pack is admitted his symptoms are returning. He notes worsening right groin pain.   No past medical history on file. No past surgical history on file. Social History  Substance Use Topics  . Smoking status: Never Smoker   . Smokeless tobacco: Never Used  . Alcohol Use: Yes   family history includes Gout in his father; Heart failure in his father.  ROS:  No headache, visual changes, nausea, vomiting, diarrhea, constipation, dizziness, abdominal pain, skin rash, fevers, chills, night sweats, weight loss, swollen lymph nodes, body aches, joint swelling, muscle aches, chest pain, shortness of breath, mood changes, visual or auditory hallucinations.    Medications: Current Outpatient Prescriptions  Medication Sig Dispense Refill  . predniSONE (STERAPRED UNI-PAK 48 TAB) 10 MG (48) TBPK tablet Take by mouth daily. 12 day dosepack po 48 tablet 0  . COLCRYS 0.6 MG tablet   0  . cyclobenzaprine (FLEXERIL) 10 MG tablet   0  . HYDROcodone-acetaminophen (NORCO/VICODIN) 5-325 MG tablet TK 1-2 T PO Q 4 H PRN FOR MODERATE PAIN OR SEVERE PAIN  0  . predniSONE (STERAPRED UNI-PAK 48 TAB) 10 MG (48) TBPK tablet TK 1 T PO ONCE D  0   No current facility-administered medications for this visit.   No Known Allergies   Exam:  BP 140/80 mmHg  Pulse 79  Wt 258 lb (117.028 kg) General: Well Developed, well nourished, and in no acute distress.   MSK: Nontender. Mild pain with hip motion.   No results found for  this or any previous visit (from the past 24 hour(s)). No results found.   Please see individual assessment and plan sections.

## 2015-08-26 NOTE — Patient Instructions (Signed)
Thank you for coming in today. Return Monday for MRI arthrogram.  Let me know what the rheumatology doctor thinks

## 2015-08-26 NOTE — Assessment & Plan Note (Signed)
Unclear etiology. Suspect rheumatologic versus labrum cause. Plan for MRI arthrogram next week. Appreciate rheumatology feedback and recommendations. I have represcribed prednisone Dosepak for use if symptoms worsen.

## 2015-09-02 ENCOUNTER — Encounter: Payer: Self-pay | Admitting: Family Medicine

## 2015-09-02 ENCOUNTER — Ambulatory Visit (INDEPENDENT_AMBULATORY_CARE_PROVIDER_SITE_OTHER): Payer: BLUE CROSS/BLUE SHIELD | Admitting: Family Medicine

## 2015-09-02 ENCOUNTER — Ambulatory Visit (INDEPENDENT_AMBULATORY_CARE_PROVIDER_SITE_OTHER): Payer: BLUE CROSS/BLUE SHIELD

## 2015-09-02 VITALS — BP 137/80 | HR 70 | Wt 254.0 lb

## 2015-09-02 DIAGNOSIS — R1031 Right lower quadrant pain: Secondary | ICD-10-CM

## 2015-09-02 DIAGNOSIS — R1030 Lower abdominal pain, unspecified: Secondary | ICD-10-CM | POA: Diagnosis not present

## 2015-09-02 DIAGNOSIS — S73101A Unspecified sprain of right hip, initial encounter: Secondary | ICD-10-CM

## 2015-09-02 DIAGNOSIS — X58XXXA Exposure to other specified factors, initial encounter: Secondary | ICD-10-CM | POA: Diagnosis not present

## 2015-09-02 DIAGNOSIS — M25561 Pain in right knee: Secondary | ICD-10-CM

## 2015-09-02 NOTE — Patient Instructions (Signed)
Thank you for coming in today.   Call or go to the ER if you develop a large red swollen joint with extreme pain or oozing puss.   

## 2015-09-02 NOTE — Progress Notes (Signed)
            Patient presents to clinic for  previously scheduled gadolinium interarticular contrast.  Procedure: Real-time Ultrasound Guided Injection of Right Hip  Device: GE Logiq E  Images permanently stored and available for review in the ultrasound unit. Verbal informed consent obtained. Discussed risks and benefits of procedure. Warned about infection bleeding damage to structures skin hypopigmentation and fat atrophy among others. Patient expresses understanding and agreement Time-out conducted.  Noted no overlying erythema, induration, or other signs of local infection.  Skin prepped in a sterile fashion.  Local anesthesia: Topical Ethyl chloride.  With sterile technique and under real time ultrasound guidance: 5 mL of lidocaine, gadolinium contrast and 9 mL of sterile saline injected easily.  Completed without difficulty    Advised to call if fevers/chills, erythema, induration, drainage, or persistent bleeding.  Images permanently stored and available for review in the ultrasound unit.  Impression: Technically successful ultrasound guided injection.

## 2015-09-03 ENCOUNTER — Ambulatory Visit (INDEPENDENT_AMBULATORY_CARE_PROVIDER_SITE_OTHER): Payer: BLUE CROSS/BLUE SHIELD | Admitting: Family Medicine

## 2015-09-03 ENCOUNTER — Encounter: Payer: Self-pay | Admitting: Family Medicine

## 2015-09-03 VITALS — BP 134/76 | HR 75 | Wt 262.0 lb

## 2015-09-03 DIAGNOSIS — S73199A Other sprain of unspecified hip, initial encounter: Secondary | ICD-10-CM | POA: Insufficient documentation

## 2015-09-03 DIAGNOSIS — S73191A Other sprain of right hip, initial encounter: Secondary | ICD-10-CM | POA: Diagnosis not present

## 2015-09-03 NOTE — Patient Instructions (Signed)
Thank you for coming in today. We think the pain is because of a labrum tear in your hip.  We will refer to an orthopedic surgeon who can fix this.  Expect a phone call in a few days.

## 2015-09-03 NOTE — Progress Notes (Signed)
Quick Note:  MRI shows a labrum tear like we suspected. Return for further discussion. ______

## 2015-09-04 NOTE — Progress Notes (Signed)
       Dillon Robinson is a 30 y.o. male who presents to [facility] today for follow-up right hip MRI. Patient has been suffering from right hip pain for now over a month. He denies any specific injury. He had an MRI arthrogram on Monday, February 27. He presents to clinic one day later for follow-up of MRI. He notes his pain is still present.   No past medical history on file. No past surgical history on file. Social History  Substance Use Topics  . Smoking status: Never Smoker   . Smokeless tobacco: Never Used  . Alcohol Use: Yes   family history includes Gout in his father; Heart failure in his father.  ROS as above Medications: No current outpatient prescriptions on file.   No current facility-administered medications for this visit.   No Known Allergies   Exam:  BP 134/76 mmHg  Pulse 75  Wt 262 lb (118.842 kg) Gen: Well NAD   No results found for this or any previous visit (from the past 24 hour(s)). Mr Hip Right W Contrast  09/02/2015  CLINICAL DATA:  Right hip pain for 1 month which is worse with sitting. Initial encounter. No known injury. EXAM: MRI OF THE RIGHT HIP WITH CONTRAST(MR Arthrogram) TECHNIQUE: Multiplanar, multisequence MR imaging of the hip was performed immediately following contrast injection into the hip joint under fluoroscopic guidance. No intravenous contrast was administered. COMPARISON:  None. FINDINGS: Bones: Normal marrow signal throughout. Articular cartilage and labrum Articular cartilage:  Unremarkable. Labrum: There is a tear of the anterior, superior labrum. See images 12 and 13 of series 5. Joint or bursal effusion Joint effusion:  None. Bursae:  Unremarkable. Muscles and tendons Muscles and tendons:  Appear normal. Other findings Miscellaneous:  Unremarkable. IMPRESSION: The study is positive for tear of the anterior, superior right acetabular labrum.  Electronically Signed   By: Drusilla Kanner M.D.   On: 09/02/2015 11:59     Please see individual assessment and plan sections.

## 2015-09-04 NOTE — Assessment & Plan Note (Signed)
Labrum tear right hip. Refer to [facility] for evaluation and consultation for benefit of hip scope.

## 2015-09-06 ENCOUNTER — Telehealth: Payer: Self-pay | Admitting: *Deleted

## 2015-09-06 NOTE — Telephone Encounter (Signed)
refaxed form. Copy located in the second shelf of my in basket.

## 2015-09-06 NOTE — Telephone Encounter (Signed)
Trey PaulaJeff from occupational health called and requests that Dr. Denyse Amassorey please change the response to question # five on the form to no. The second part to question 6 he asked that it be changed to intermitant.He also requests to change the answer to question 7 to yes and include that the patient should be out for two days per episode. Trey PaulaJeff states this will allow the patient to come back to work. When he has a "flare up" generally if he has a day or two to rest then he is ok.Marland Kitchen. He request this to be faxed back to him with corrections. Card attatched to form

## 2015-09-06 NOTE — Telephone Encounter (Signed)
Form corrected. Re-fax it and keep a copy.

## 2015-10-22 ENCOUNTER — Encounter: Payer: Self-pay | Admitting: Family Medicine

## 2015-10-22 ENCOUNTER — Ambulatory Visit (INDEPENDENT_AMBULATORY_CARE_PROVIDER_SITE_OTHER): Payer: BLUE CROSS/BLUE SHIELD | Admitting: Family Medicine

## 2015-10-22 VITALS — BP 128/82 | HR 90 | Ht 74.0 in | Wt 269.0 lb

## 2015-10-22 DIAGNOSIS — R635 Abnormal weight gain: Secondary | ICD-10-CM

## 2015-10-22 MED ORDER — NALTREXONE-BUPROPION HCL ER 8-90 MG PO TB12: ORAL_TABLET | ORAL | Status: DC

## 2015-10-22 NOTE — Patient Instructions (Signed)
Thank you for coming in today. Start Contrave.  We may need to switch to some other medicine based on insurance preference.   Return in 3  Months. We will also refer to a registered dietitian  Bupropion; Naltrexone extended-release tablets What is this medicine? BUPROPION; NALTREXONE (byoo PROE pee on; nal TREX one) is a combination product used to promote and maintain weight loss in obese adults or overweight adults who also have weight related medical problems. This medicine should be used with a reduced calorie diet and increased physical activity. This medicine may be used for other purposes; ask your health care provider or pharmacist if you have questions. What should I tell my health care provider before I take this medicine? They need to know if you have any of these conditions: -an eating disorder, such as anorexia or bulimia -diabetes -glaucoma -head injury -heart disease -high blood pressure -history of a drug or alcohol abuse problem -history of a tumor or infection of your brain or spine -history of stroke -history of irregular heartbeat -kidney disease -liver disease -mental illness such as bipolar disorder or psychosis -seizures -suicidal thoughts, plans, or attempt; a previous suicide attempt by you or a family member -an unusual or allergic reaction to bupropion, naltrexone, other medicines, foods, dyes, or preservatives breast-feeding -pregnant or trying to become pregnant How should I use this medicine? Take this medicine by mouth with a glass of water. Follow the directions on the prescription label. Take this medicine in the morning and in the evenings as directed by your healthcare professional. Bonita Quin can take it with or without food. Do not take with high-fat meals as this may increase your risk of seizures. Do not crush, chew, or cut these tablets. Do not take your medicine more often than directed. Do not stop taking this medicine suddenly except upon the advice  of your doctor. A special MedGuide will be given to you by the pharmacist with each prescription and refill. Be sure to read this information carefully each time. Talk to your pediatrician regarding the use of this medicine in children. Special care may be needed. Overdosage: If you think you have taken too much of this medicine contact a poison control center or emergency room at once. NOTE: This medicine is only for you. Do not share this medicine with others. What if I miss a dose? If you miss a dose, skip the missed dose and take your next tablet at the regular time. Do not take double or extra doses. What may interact with this medicine? Do not take this medicine with any of the following medications: -any prescription or street opioid drug like codiene, heroin, methadone -linezolid -MAOIs like Carbex, Eldepryl, Marplan, Nardil, and Parnate -methylene blue (injected into a vein) -other medicines that contain bupropion like Zyban or Wellbutrin This medicine may also interact with the following medications: -alcohol -certain medicines for anxiety or sleep -certain medicines for blood pressure like metoprolol, propranolol -certain medicines for depression or psychotic disturbances -certain medicines for HIV or AIDS like efavirenz, lopinavir, nelfinavir, ritonavir -certain medicines for irregular heart beat like propafenone, flecainide -certain medicines for Parkinson's disease like amantadine, levodopa -certain medicines for seizures like carbamazepine, phenytoin, phenobarbital -cimetidine -clopidogrel -cyclophosphamide -disulfiram -furazolidone -isoniazid -nicotine -orphenadrine -procarbazine -steroid medicines like prednisone or cortisone -stimulant medicines for attention disorders, weight loss, or to stay awake -tamoxifen -theophylline -thioridazine -thiotepa -ticlopidine -tramadol -warfarin This list may not describe all possible interactions. Give your health care  provider a list of all the  medicines, herbs, non-prescription drugs, or dietary supplements you use. Also tell them if you smoke, drink alcohol, or use illegal drugs. Some items may interact with your medicine. What should I watch for while using this medicine? This medicine is intended to be used in addition to a healthy diet and appropriate exercise. The best results are achieved this way. Do not increase or in any way change your dose without consulting your doctor or health care professional. Do not take this medicine with other prescription or over-the-counter weight loss products without consulting your doctor or health care professional. Your doctor should tell you to stop taking this medicine if you do not lose a certain amount of weight within the first 12 weeks of treatment. Visit your doctor or health care professional for regular checkups. Your doctor may order blood tests or other tests to see how you are doing. This medicine may affect blood sugar levels. If you have diabetes, check with your doctor or health care professional before you change your diet or the dose of your diabetic medicine. Patients and their families should watch out for new or worsening depression or thoughts of suicide. Also watch out for sudden changes in feelings such as feeling anxious, agitated, panicky, irritable, hostile, aggressive, impulsive, severely restless, overly excited and hyperactive, or not being able to sleep. If this happens, especially at the beginning of treatment or after a change in dose, call your health care professional. Avoid alcoholic drinks while taking this medicine. Drinking large amounts of alcoholic beverages, using sleeping or anxiety medicines, or quickly stopping the use of these agents while taking this medicine may increase your risk for a seizure. What side effects may I notice from receiving this medicine? Side effects that you should report to your doctor or health care professional  as soon as possible: -allergic reactions like skin rash, itching or hives, swelling of the face, lips, or tongue -breathing problems -changes in vision, hearing -chest pain -confusion -dark urine -depressed mood -fast or irregular heart beat -fever -hallucination, loss of contact with reality -increased blood pressure -light-colored stools -redness, blistering, peeling or loosening of the skin, including inside the mouth -right upper belly pain -seizures -suicidal thoughts or other mood changes -unusually weak or tired -vomiting -yellowing of the eyes or skin Side effects that usually do not require medical attention (Report these to your doctor or health care professional if they continue or are bothersome.): -constipation -diarrhea -dizziness -dry mouth -headache -nausea -trouble sleeping This list may not describe all possible side effects. Call your doctor for medical advice about side effects. You may report side effects to FDA at 1-800-FDA-1088. Where should I keep my medicine? Keep out of the reach of children. Store at room temperature between 15 and 30 degrees C (59 and 86 degrees F). Throw away any unused medicine after the expiration date. NOTE: This sheet is a summary. It may not cover all possible information. If you have questions about this medicine, talk to your doctor, pharmacist, or health care provider.    2016, [employer]vier/[employer] Standard. (2013-03-29 15:17:29)

## 2015-10-23 NOTE — Assessment & Plan Note (Addendum)
Discussed medicine options. Refer to registered dietitian. Prescribed contrast. Recheck in 3 months. New problem

## 2015-10-23 NOTE — Progress Notes (Signed)
       Dillon Robinson is a 30 y.o. male who presents to [facility] today for discuss weight loss and establish primary Robinson. Patient has previously been seen for sports medicine related hip pain. He would like to establish Robinson here as a primary Robinson patient. His primary reason for visit is weight loss and weight management. He struggled with obesity for most of his adult life. In the past cc phentermine which worked for a few months then he regained the weight. He's interested in contrary if possible. He has never been seen by a registered dietitian.   History reviewed. No pertinent past medical history. History reviewed. No pertinent past surgical history. Social History  Substance Use Topics  . Smoking status: Never Smoker   . Smokeless tobacco: Never Used  . Alcohol Use: Yes   family history includes Gout in his father; Heart failure in his father.  ROS as above Medications: Current Outpatient Prescriptions  Medication Sig Dispense Refill  . Naltrexone-Bupropion HCl ER 8-90 MG TB12 1 tab daily for week 1, then 1 tab BID for week 2, then 2 tab PO qAM and 1 tab PO qPM for week 3, then 2 tabs BID. 80 tablet 0   No current facility-administered medications for this visit.   No Known Allergies   Exam:  BP 128/82 mmHg  Pulse 90  Ht 6\' 2"  (1.88 m)  Wt 269 lb (122.018 kg)  BMI 34.52 kg/m2 Gen: Well NAD Obese HEENT: EOMI,  MMM Lungs: Normal work of breathing. CTABL Heart: RRR no MRG Abd: NABS, Soft. Nondistended, Nontender Exts: Brisk capillary refill, warm and well perfused.   No results found for this or any previous visit (from the past 24 hour(s)). No results found.   Please see individual assessment and plan sections.

## 2015-10-24 ENCOUNTER — Telehealth: Payer: Self-pay | Admitting: *Deleted

## 2015-10-24 NOTE — Telephone Encounter (Signed)
Initiated PA for [employer] .fax[place] ([employer] [place] [address] [place] [address]

## 2015-10-25 NOTE — Telephone Encounter (Signed)
Contrave has been denied by insurance because it is not covered at all by insurance. Denial letter place in provider's box

## 2015-10-31 ENCOUNTER — Telehealth: Payer: Self-pay | Admitting: *Deleted

## 2015-10-31 NOTE — Telephone Encounter (Signed)
The patient will need to call his insurance and see what wt loss meds are covered if any under his plan formulary. Pt notified. He will call the office back and let us know

## 2015-11-11 DIAGNOSIS — M25551 Pain in right hip: Secondary | ICD-10-CM | POA: Diagnosis not present

## 2015-12-05 ENCOUNTER — Encounter: Payer: Self-pay | Admitting: *Deleted

## 2015-12-05 ENCOUNTER — Emergency Department
Admission: EM | Admit: 2015-12-05 | Discharge: 2015-12-05 | Disposition: A | Discharge status: Home / Self Care | Payer: BLUE CROSS/BLUE SHIELD | Source: Home / Self Care | Attending: Family Medicine | Admitting: Family Medicine

## 2015-12-05 ENCOUNTER — Emergency Department (INDEPENDENT_AMBULATORY_CARE_PROVIDER_SITE_OTHER): Payer: BLUE CROSS/BLUE SHIELD

## 2015-12-05 DIAGNOSIS — R079 Chest pain, unspecified: Secondary | ICD-10-CM

## 2015-12-05 DIAGNOSIS — R042 Hemoptysis: Secondary | ICD-10-CM

## 2015-12-05 DIAGNOSIS — J209 Acute bronchitis, unspecified: Secondary | ICD-10-CM

## 2015-12-05 DIAGNOSIS — R071 Chest pain on breathing: Secondary | ICD-10-CM

## 2015-12-05 MED ORDER — IBUPROFEN 800 MG PO TABS
800.0000 mg | ORAL_TABLET | Freq: Once | ORAL | Status: AC
  Administered 2015-12-05: 800 mg via ORAL

## 2015-12-05 MED ORDER — BENZONATATE 200 MG PO CAPS: 200.0000 mg | ORAL_CAPSULE | Freq: Every day | ORAL | Status: DC

## 2015-12-05 MED ORDER — CLARITHROMYCIN 500 MG PO TABS: 500.0000 mg | ORAL_TABLET | Freq: Two times a day (BID) | ORAL | Status: DC

## 2015-12-05 NOTE — ED Provider Notes (Signed)
CSN: [id number]     Arrival date & time 12/05/15  1006 History   First MD Initiated Contact with Patient 12/05/15 1021     Chief Complaint  Patient presents with  . Chest Pain  . Cough      HPI Comments: Two days ago patient developed pain and tightness across his anterior chest, worse with inspiration, and he felt shortness of breath.  Yesterday he developed a non-productive cough and fatigue.  This morning he coughed up some blood while in the shower.  He has had chills today without fever.  No sore throat.  The history is provided by the patient.    History reviewed. No pertinent past medical history. History reviewed. No pertinent past surgical history. Family History  Problem Relation Age of Onset  . Heart failure Father   . Gout Father    Social History  Substance Use Topics  . Smoking status: Never Smoker   . Smokeless tobacco: Never Used  . Alcohol Use: Yes    Review of Systems No sore throat + cough No pleuritic pain but has tightness across anterior chest with cough and inspiration No wheezing Mild nasal congestion No post-nasal drainage No sinus pain/pressure No itchy/red eyes No earache + hemoptysis No SOB No fever, + chills No nausea No vomiting No abdominal pain No diarrhea No urinary symptoms No skin rash + fatigue No myalgias + headache Used OTC meds without relief  Allergies  Review of patient's allergies indicates no known allergies.  Home Medications   Prior to Admission medications   Medication Sig Start Date End Date Taking? Authorizing Provider  benzonatate (TESSALON) 200 MG capsule Take 1 capsule (200 mg total) by mouth at bedtime. Take as needed for cough 12/05/15   Lattie Haw, MD  clarithromycin (BIAXIN) 500 MG tablet Take 1 tablet (500 mg total) by mouth 2 (two) times daily. 12/05/15   Lattie Haw, MD  Naltrexone-Bupropion HCl ER 8-90 MG TB12 1 tab daily for week 1, then 1 tab BID for week 2, then 2 tab PO qAM and 1 tab PO qPM  for week 3, then 2 tabs BID. 10/22/15   Rodolph Bong, MD   Meds Ordered and Administered this Visit  Medications - No data to display  BP 125/86 mmHg  Pulse 100  Temp(Src) 98.8 F (37.1 C) (Oral)  Resp 16  Wt 262 lb (118.842 kg)  SpO2 96% No data found.   Physical Exam Nursing notes and Vital Signs reviewed. Appearance:  Patient appears stated age, and in no acute distress Eyes:  Pupils are equal, round, and reactive to light and accomodation.  Extraocular movement is intact.  Conjunctivae are not inflamed  Ears:  Canals normal.  Tympanic membranes normal.  Nose:  Mildly congested turbinates.  No sinus tenderness.   Pharynx:  Normal Neck:  Supple.  Slightly tender enlarged posterior/lateral nodes are palpated bilaterally  Lungs:  Clear to auscultation.  Breath sounds are equal.  Moving air well. Chest:  No tenderness to palpation  Heart:  Regular rate and rhythm without murmurs, rubs, or gallops.  Abdomen:  Nontender without masses or hepatosplenomegaly.  Bowel sounds are present.  No CVA or flank tenderness.  Extremities:  No edema or calf tenderness. Skin:  No rash present.   ED Course  Procedures none    Labs Reviewed -  EKG: Rate:  96 BPM PR:  138 msec QT:  324 msec QTcH:  387 msec QRSD:  102 msec QRS axis:  37 degrees Interpretation:  Normal sinus rhythm.  RSR(V1) probably within normal limits; no acute changes  Imaging Review Dg Chest 2 View  12/05/2015  CLINICAL DATA:  Chest pain with hemoptysis EXAM: CHEST  2 VIEW COMPARISON:  None. FINDINGS: There is no edema or consolidation. The heart size and pulmonary vascularity are normal. No adenopathy. No bone lesions. No pneumothorax. IMPRESSION: No edema or consolidation. Electronically Signed   By: Bretta BangWilliam  Woodruff III M.D.   On: 12/05/2015 10:30     MDM   1. Chest pain on breathing   2. Acute bronchitis, unspecified organism    Begin Biaxin 500mg  BID for atypical coverage.  Prescription written for Benzonatate  [facility](Tessalon) to take at bedtime for night-time cough.  Take plain guaifenesin (1200mg  extended release tabs such as Mucinex) twice daily, with plenty of water, for cough and congestion.  May add Pseudoephedrine (30mg , one or two every 4 to 6 hours) for sinus congestion.  Get adequate rest.   May use Afrin nasal spray (or generic oxymetazoline) twice daily for about 5 days and then discontinue.  Also recommend using saline nasal spray several times daily and saline nasal irrigation (AYR is a common brand).   Try warm salt water gargles for sore throat.  Stop all antihistamines for now, and other non-prescription cough/cold preparations. May take Ibuprofen 200mg , 4 tabs every 8 hours with food for chest/sternum discomfort, headache, etc.   Follow-up with family doctor if not improving about 7 to10 days.     Lattie HawStephen A Teller Wakefield, MD 12/05/15 [phone]

## 2015-12-05 NOTE — ED Notes (Signed)
Pt c/o CP and HA since yesterday, cough x 2 days. This AM, CP is better but painful to take a deep breath. Coughed up bloody sputum in the shower this AM. EKG and CXR done

## 2015-12-05 NOTE — Discharge Instructions (Signed)
Take plain guaifenesin (1200mg  extended release tabs such as Mucinex) twice daily, with plenty of water, for cough and congestion.  May add Pseudoephedrine (30mg , one or two every 4 to 6 hours) for sinus congestion.  Get adequate rest.   May use Afrin nasal spray (or generic oxymetazoline) twice daily for about 5 days and then discontinue.  Also recommend using saline nasal spray several times daily and saline nasal irrigation (AYR is a common brand).   Try warm salt water gargles for sore throat.  Stop all antihistamines for now, and other non-prescription cough/cold preparations. May take Ibuprofen 200mg , 4 tabs every 8 hours with food for chest/sternum discomfort, headache, etc.   Follow-up with family doctor if not improving about 7 to10 days.

## 2016-10-20 ENCOUNTER — Ambulatory Visit (INDEPENDENT_AMBULATORY_CARE_PROVIDER_SITE_OTHER): Payer: BLUE CROSS/BLUE SHIELD | Admitting: Family Medicine

## 2016-10-20 ENCOUNTER — Encounter: Payer: Self-pay | Admitting: Family Medicine

## 2016-10-20 DIAGNOSIS — R030 Elevated blood-pressure reading, without diagnosis of hypertension: Secondary | ICD-10-CM | POA: Diagnosis not present

## 2016-10-20 DIAGNOSIS — M7989 Other specified soft tissue disorders: Secondary | ICD-10-CM | POA: Insufficient documentation

## 2016-10-20 LAB — COMPREHENSIVE METABOLIC PANEL
ALBUMIN: 4.4 g/dL (ref 3.6–5.1)
ALK PHOS: 61 U/L (ref 40–115)
ALT: 22 U/L (ref 9–46)
AST: 17 U/L (ref 10–40)
BUN: 11 mg/dL (ref 7–25)
CALCIUM: 9.6 mg/dL (ref 8.6–10.3)
CO2: 25 mmol/L (ref 20–31)
Chloride: 105 mmol/L (ref 98–110)
Creat: 1.31 mg/dL (ref 0.60–1.35)
Glucose, Bld: 83 mg/dL (ref 65–99)
POTASSIUM: 4.1 mmol/L (ref 3.5–5.3)
Sodium: 140 mmol/L (ref 135–146)
TOTAL PROTEIN: 7.5 g/dL (ref 6.1–8.1)
Total Bilirubin: 0.4 mg/dL (ref 0.2–1.2)

## 2016-10-20 LAB — URIC ACID: URIC ACID, SERUM: 8.3 mg/dL — AB (ref 4.0–8.0)

## 2016-10-20 LAB — CBC WITH DIFFERENTIAL/PLATELET
Basophils Absolute: 0 cells/uL (ref 0–200)
Basophils Relative: 0 %
EOS PCT: 1 %
Eosinophils Absolute: 80 cells/uL (ref 15–500)
HCT: 42.3 % (ref 38.5–50.0)
HEMOGLOBIN: 14.6 g/dL (ref 13.2–17.1)
LYMPHS PCT: 27 %
Lymphs Abs: 2160 cells/uL (ref 850–3900)
MCH: 29.1 pg (ref 27.0–33.0)
MCHC: 34.5 g/dL (ref 32.0–36.0)
MCV: 84.3 fL (ref 80.0–100.0)
MONOS PCT: 7 %
MPV: 10.2 fL (ref 7.5–12.5)
Monocytes Absolute: 560 cells/uL (ref 200–950)
NEUTROS PCT: 65 %
Neutro Abs: 5200 cells/uL (ref 1500–7800)
PLATELETS: 188 10*3/uL (ref 140–400)
RBC: 5.02 MIL/uL (ref 4.20–5.80)
RDW: 13.4 % (ref 11.0–15.0)
WBC: 8 10*3/uL (ref 3.8–10.8)

## 2016-10-20 LAB — ANGIOTENSIN CONVERTING ENZYME: Angiotensin-Converting Enzyme: 24 U/L (ref 9–67)

## 2016-10-20 LAB — CK: Total CK: 99 U/L (ref 7–232)

## 2016-10-20 NOTE — Progress Notes (Signed)
Dillon Robinson is a 31 y.o. male who presents to [facility] today for 1 day history of bilateral swollen, itchy hands.  He noticed his hands were stiff and swollen at noon yesterday with bruising over his PIP and MCP.  He took a benadryl at nine that night but reported no relief of symptoms.  At work this morning, his hands had not improved and he had a nurse evaluated him who told him to come in today.  She recorded elevated blood pressures at the time of 166/118 on the left and 158/116 on the right.  He arrived in clinic this afternoon with mild improvement of symptoms.    At the time of the visit, his hands were still swollen but had noticeably improved.   He denies pain or tenderness and is no longer pruritic.  His hands were slightly erythematous bilaterally.  He denies fever, weight loss, cough, recent travel, sick contacts, or allergies.  He has not changed his habits or diet and states that yesterday was a typical day for him.  He denies any prior similar episodes.   His father has gout but he denies any other family history.   His blood pressure was still elevated at 152/83.     His history is significant for hip pain about 1.5 years ago that resolved without definite explanation. He did have a positive ANA titer and an Uric Acid mildly elevated at 7.2.   No past medical history on file. No past surgical history on file. Social History  Substance Use Topics  . Smoking status: Never Smoker  . Smokeless tobacco: Never Used  . Alcohol use Yes   family history includes Gout in his father; Heart failure in his father.  ROS as above:  Medications: No current outpatient prescriptions on file.   No current facility-administered medications for this visit.    No Known Allergies  Health Maintenance Health Maintenance  Topic Date Due  . HIV Screening  11/06/1998    . TETANUS/TDAP  10/21/2016 (Originally 11/06/2002)  . INFLUENZA VACCINE  02/03/2017     Exam:  BP (!) 152/83   Pulse 73   Ht  (1.88 m)   Wt 283 lb (128.4 kg)   SpO2 98%   BMI 36.34 kg/m  Gen: Well NAD HEENT: EOMI,  MMM Lungs: Normal work of breathing. CTABL Heart: RRR no MRG Abd: NABS, Soft. Nondistended, Nontender Exts: Brisk capillary refill, warm and well perfused.   Hands  Mildly erythematous. Mild nonpitting edema hands bilaterally  Full ROM, strength bilaterally. No tenderness over joints. Radial pulses strong. Sensation and capillary refill are intact distally.   No results found for this or any previous visit (from the past 72 hour(s)). No results found.    Assessment and Plan: 31 y.o. male with bilateral hand swelling for 1 day and hypertension.  Since he is improving at the time of today's visit, we will focus on determining the etiology of his symptoms.  A prior ANA titer done when pt presented hip pain was elevated.  This will be repeated along with RF, CK, sed rate, complement levels, ACE, CMP, anti-ds DNA for possible vasculitis or rheumatalgic pathology.  Although his serum uric acid has been elevated in the past, gout seems less likely with bilateral involvement.  His uric acid will be rechecked as well. Environmental exposure is possible but the patient can't recall any possible sources.     Patient  was instructed in ambulatory blood pressure monitoring and FU in 3-4 weeks to review labs and possible essential hypertension.     Orders Placed This Encounter  Procedures  . CBC with Differential/Platelet  . Sedimentation rate  . Comprehensive metabolic panel  . Rheumatoid factor  . ANA  . CK  . Uric acid  . Complement, total  . Angiotensin converting enzyme  . Porphobilinogen, random urine  . VITAMIN D 25 Hydroxy (Vit-D Deficiency, Fractures)  . Anti-DNA antibody, double-stranded   No orders of the defined types were placed in this  encounter.    Discussed warning signs or symptoms. Please see discharge instructions. Patient expresses understanding.

## 2016-10-20 NOTE — Patient Instructions (Addendum)
Thank you for coming in today. Get labs today.  Get a large home blood pressure cuff.  Check pressure daily and keep a log.  Return in 3 - 4 weeks with the cuff and the log to go over results.  Return sooner if needed.   Systemic Lupus Erythematosus, Adult Systemic lupus erythematosus is a long-term (chronic) disease that can affect many parts of the body. It can damage the skin, joints, blood vessels, brain, kidneys, lungs, heart, and other internal organs. It causes pain, irritation, and inflammation. Systemic lupus erythematosus is an autoimmune disease. With this type of disease, the body's defense system (immune system) mistakenly attacks normal tissues instead of attacking germs or abnormal growths. What are the causes? The cause of this condition is not known. What increases the risk? This condition is more likely to develop in:  Females.  People of Asian descent.  People of African-American descent.  People who have a family history of the condition. What are the signs or symptoms? General symptoms include:  Joint pain and swelling (common).  Fever.  Fatigue.  Unusual weight loss or weight gain.  Skin rashes, especially over the nose and cheeks (butterfly rash) and after sun exposure.  Sores inside the mouth or nose. Other symptoms depend on which parts of the body are affected. They can include:  Shortness of breath.  Chest pain.  Frequent urination.  Blood in the urine.  Seizures.  Mental changes.  Hair loss.  Swollen and tender lymph nodes.  Swelling of the hands or feet. Symptoms can come and go. A period of time when symptoms get worse or come back is called a flare. A period of time with no symptoms is called a remission. How is this diagnosed? This condition is diagnosed based on symptoms, a medical history, and a physical exam. You may also have tests, including:  Blood tests.  Urine tests.  A chest X-ray.  A skin or kidney biopsy. For  this test, a sample of tissue is taken from the skin or kidney and studied under a microscope. You may be referred to an autoimmune disease specialist (rheumatologist). How is this treated? There is no cure for this condition, but treatment can keep the disease in remission, help to control symptoms, and prevent damage to the heart, lungs, kidneys, and other organs. Treatment may involve taking a combination of medicines over time. Follow these instructions at home: Medicines   Take medicines only as directed by your health care provider.  Do not take any medicines that contain estrogen without first checking with your health care provider. Estrogen can trigger flares and may increase your risk for blood clots. Lifestyle   Eat a heart-healthy diet.  Stay active as directed by your health care provider.  Do not smoke. If you need help quitting, ask your health care provider.  Protect your skin from the sun by applying sunblock and wearing protective hats and clothing.  Learn as much as you can about your condition and have a good support system in place. Support may come from family, friends, or a lupus support group. General instructions   Keep all follow-up visits as directed by your health care provider. This is important.  Work closely with all of your health care providers to manage your condition.  Let your health care provider know right away if you become pregnant or if you plan to become pregnant. Pregnancy in women with this condition is considered high risk. Contact a health care provider if:  You have a fever.  Your symptoms flare.  You develop new symptoms.  You develop swollen feet or hands.  You develop puffiness around your eyes.  Your medicines are not working.  You have bloody, foamy, or coffee-colored urine.  There are changes in your urination. For example, you urinate more often at night.  You think that you may be depressed or have anxiety. Get help  right away if:  You have chest pain.  You have trouble breathing.  You have a seizure.  You suddenly get a very bad headache.  You suddenly develop facial or body weakness.  You cannot speak.  You cannot understand speech. This information is not intended to replace advice given to you by your health care provider. Make sure you discuss any questions you have with your health care provider. Document Released: 06/12/2002 Document Revised: 02/16/2016 Document Reviewed: 05/30/2014 Elsevier Interactive Patient Education  2017 [employer].

## 2016-10-20 NOTE — Progress Notes (Signed)
1 

## 2016-10-21 ENCOUNTER — Encounter: Payer: Self-pay | Admitting: Family Medicine

## 2016-10-21 ENCOUNTER — Ambulatory Visit (INDEPENDENT_AMBULATORY_CARE_PROVIDER_SITE_OTHER): Payer: BLUE CROSS/BLUE SHIELD | Admitting: Family Medicine

## 2016-10-21 VITALS — BP 143/80 | HR 73 | Temp 98.1°F | Wt 284.0 lb

## 2016-10-21 DIAGNOSIS — T783XXA Angioneurotic edema, initial encounter: Secondary | ICD-10-CM | POA: Diagnosis not present

## 2016-10-21 DIAGNOSIS — E559 Vitamin D deficiency, unspecified: Secondary | ICD-10-CM | POA: Diagnosis not present

## 2016-10-21 DIAGNOSIS — R030 Elevated blood-pressure reading, without diagnosis of hypertension: Secondary | ICD-10-CM

## 2016-10-21 HISTORY — DX: Vitamin D deficiency, unspecified: E55.9

## 2016-10-21 HISTORY — DX: Angioneurotic edema, initial encounter: T78.3XXA

## 2016-10-21 LAB — ANA: ANA: POSITIVE — AB

## 2016-10-21 LAB — VITAMIN D 25 HYDROXY (VIT D DEFICIENCY, FRACTURES): Vit D, 25-Hydroxy: 21 ng/mL — ABNORMAL LOW (ref 30–100)

## 2016-10-21 LAB — ANTI-DNA ANTIBODY, DOUBLE-STRANDED: ds DNA Ab: <1 IU/mL

## 2016-10-21 LAB — ANTI-NUCLEAR AB-TITER (ANA TITER)

## 2016-10-21 LAB — SEDIMENTATION RATE: SED RATE: 18 mm/h — AB (ref 0–15)

## 2016-10-21 LAB — RHEUMATOID FACTOR

## 2016-10-21 MED ORDER — EPINEPHRINE 0.3 MG/0.3ML IJ SOAJ: 0.3000 mg | Freq: Once | INTRAMUSCULAR | 1 refills | Status: DC | PRN

## 2016-10-21 NOTE — Progress Notes (Signed)
Dillon Robinson is a 31 y.o. male who presents to [place]: [place] today for lip swelling. Patient was seen yesterday for bilateral hand swelling. The differential that time was somewhat unclear. He returns this morning after developing lip swelling at work. He notes the swelling is already improving and he denies any tongue swelling or trouble breathing. He denies fevers or chills vomiting or diarrhea. He cannot think of any new exposures to new foods cosmetic soaps detergents or shampoos or new occupational exposure. He denies a personal history of angioedema and does not take any medications regularly. He took a Claritin at the nurse's office at work today which maybe helped a little.   Past Medical History:  Diagnosis Date  . Angioedema 10/21/2016  . Vitamin D deficiency 10/21/2016   No past surgical history on file. Social History  Substance Use Topics  . Smoking status: Never Smoker  . Smokeless tobacco: Never Used  . Alcohol use Yes   family history includes Gout in his father; Heart failure in his father.  ROS as above:  Medications: Current Outpatient Prescriptions  Medication Sig Dispense Refill  . EPINEPHrine 0.3 mg/0.3 mL IJ SOAJ injection Inject 0.3 mLs (0.3 mg total) into the muscle once as needed. Severe lip, tongue or throat swelling 2 Device 1   No current facility-administered medications for this visit.    No Known Allergies  Health Maintenance Health Maintenance  Topic Date Due  . HIV Screening  11/06/1998  . TETANUS/TDAP  10/21/2016 (Originally 11/06/2002)  . INFLUENZA VACCINE  02/03/2017     Exam:  BP (!) 143/80   Pulse 73   Temp 98.1 F (36.7 C) (Oral)   Wt 284 lb (128.8 kg)   SpO2 100%   BMI 36.46 kg/m  Gen: Well NAD HEENT: EOMI,  MMM Mild left-sided lip swelling. No tongue swelling. No trouble breathing. Lungs: Normal work of  breathing. CTABL Heart: RRR no MRG Abd: NABS, Soft. Nondistended, Nontender Exts: Brisk capillary refill, warm and well perfused. No hand swelling today. No rash.   Results for orders placed or performed in visit on 10/20/16 (from the past 72 hour(s))  Anti-DNA antibody, double-stranded     Status: None   Collection Time: 10/20/16 10:52 AM  Result Value Ref Range   ds DNA Ab <1 IU/mL    Comment:                                 IU/mL       Interpretation                               < or = 4    Negative                               5-9         Indeterminate                               > or = 10   Positive     CBC with Differential/Platelet     Status: None   Collection Time: 10/20/16 10:54 AM  Result Value Ref Range   WBC 8.0 3.8 - 10.8 K/uL  RBC 5.02 4.20 - 5.80 MIL/uL   Hemoglobin 14.6 13.2 - 17.1 g/dL   HCT 42.3 38.5 - 50.0 %   MCV 84.3 80.0 - 100.0 fL   MCH 29.1 27.0 - 33.0 pg   MCHC 34.5 32.0 - 36.0 g/dL   RDW 13.4 11.0 - 15.0 %   Platelets 188 140 - 400 K/uL   MPV 10.2 7.5 - 12.5 fL   Neutro Abs 5,200 1,500 - 7,800 cells/uL   Lymphs Abs 2,160 850 - 3,900 cells/uL   Monocytes Absolute 560 200 - 950 cells/uL   Eosinophils Absolute 80 15 - 500 cells/uL   Basophils Absolute 0 0 - 200 cells/uL   Neutrophils Relative % 65 %   Lymphocytes Relative 27 %   Monocytes Relative 7 %   Eosinophils Relative 1 %   Basophils Relative 0 %   Smear Review Criteria for review not met   Sedimentation rate     Status: Abnormal   Collection Time: 10/20/16 10:54 AM  Result Value Ref Range   Sed Rate 18 (H) 0 - 15 mm/hr  Comprehensive metabolic panel     Status: None   Collection Time: 10/20/16 10:54 AM  Result Value Ref Range   Sodium 140 135 - 146 mmol/L   Potassium 4.1 3.5 - 5.3 mmol/L   Chloride 105 98 - 110 mmol/L   CO2 25 20 - 31 mmol/L   Glucose, Bld 83 65 - 99 mg/dL   BUN 11 7 - 25 mg/dL   Creat 1.31 0.60 - 1.35 mg/dL   Total Bilirubin 0.4 0.2 - 1.2 mg/dL   Alkaline  Phosphatase 61 40 - 115 U/L   AST 17 10 - 40 U/L   ALT 22 9 - 46 U/L   Total Protein 7.5 6.1 - 8.1 g/dL   Albumin 4.4 3.6 - 5.1 g/dL   Calcium 9.6 8.6 - 10.3 mg/dL  Rheumatoid factor     Status: None   Collection Time: 10/20/16 10:54 AM  Result Value Ref Range   Rhuematoid fact SerPl-aCnc <14 <14 IU/mL  ANA     Status: None (Preliminary result)   Collection Time: 10/20/16 10:54 AM  Result Value Ref Range   Anit Nuclear Antibody(ANA)  NEGATIVE  CK     Status: None   Collection Time: 10/20/16 10:54 AM  Result Value Ref Range   Total CK 99 7 - 232 U/L  Uric acid     Status: Abnormal   Collection Time: 10/20/16 10:54 AM  Result Value Ref Range   Uric Acid, Serum 8.3 (H) 4.0 - 8.0 mg/dL  Complement, total     Status: None (Preliminary result)   Collection Time: 10/20/16 10:54 AM  Result Value Ref Range   Compl, Total (CH50)    Angiotensin converting enzyme     Status: None   Collection Time: 10/20/16 10:54 AM  Result Value Ref Range   Angiotensin-Converting Enzyme 24 9 - 67 U/L    Comment: ** Please note change in reference range(s). **     VITAMIN D 25 Hydroxy (Vit-D Deficiency, Fractures)     Status: Abnormal   Collection Time: 10/20/16 10:54 AM  Result Value Ref Range   Vit D, 25-Hydroxy 21 (L) 30 - 100 ng/mL    Comment: Vitamin D Status           25-OH Vitamin D        Deficiency                <  20 ng/mL        Insufficiency         20 - 29 ng/mL        Optimal             > or = 30 ng/mL   For 25-OH Vitamin D testing on patients on D2-supplementation and patients for whom quantitation of D2 and D3 fractions is required, the [facility] 25-OH VIT D, (D2,D3), LC/MS/MS is recommended: order code [id number] (patients > 2 yrs).    No results found.    Assessment and Plan: 31 y.o. male with Angioedema. Angioedema explains both the lips swelling in hand swelling. Unfortunately don't have a clear explanation for angioedema. He does not take any medications and denies any  exposures. This can certainly be genetic or inherited. It could also be due to allergies. Plan to start high-dose antihistamine. Will take 10 mg a cetirizine twice daily along with H2 blockers Pepcid or Zantac twice daily. We'll broaden workup. Complement total from previous workup is pending. We'll add on complement 1 total if possible. Additionally will refer to allergy/immunology. I have prescribed an EpiPen for use if patient develops significant angioedema. Otherwise will recheck in 2 weeks.   Orders Placed This Encounter  Procedures  . Complement component c1q    Add on if able  . Ambulatory referral to Allergy    Referral Priority:   Routine    Referral Type:   Allergy Testing    Referral Reason:   Specialty Services Required    Requested Specialty:   Allergy    Number of Visits Requested:   1   Meds ordered this encounter  Medications  . EPINEPHrine 0.3 mg/0.3 mL IJ SOAJ injection    Sig: Inject 0.3 mLs (0.3 mg total) into the muscle once as needed. Severe lip, tongue or throat swelling    Dispense:  2 Device    Refill:  1     Discussed warning signs or symptoms. Please see discharge instructions. Patient expresses understanding.

## 2016-10-21 NOTE — Patient Instructions (Signed)
Thank you for coming in today. I think this is Angioedema.  Take Zyrtec (cetrizine)  twice daily.  Take zantac or pepcid twice daily as well.  If you have severe tongue or lip swelling or throat swelling that is causing trouble breathing use the epinephrine injector.  Recheck in 2 weeks  I am referring to allergy/immunology. You should hear from them soon.   Let me know if you don't get better.   Angioedema Angioedema is sudden swelling in the body. The swelling can happen in any part of the body. It often happens on the skin and causes itchy, bumpy patches (hives) to form. This condition may:  Happen only one time.  Happen more than one time. It may come back at random times.  Keep coming back for a number of years. Someday it may stop coming back. Follow these instructions at home:  Take over-the-counter and prescription medicines only as told by your doctor.  If you were given medicines for emergency allergy treatment, always carry them with you.  Wear a medical bracelet as told by your doctor.  Avoid the things that cause your attacks (triggers).  If this condition was passed to you from your parents and you want to have kids, talk to your doctor. Your kids may also have this condition. Contact a doctor if:  You have another attack.  Your attacks happen more often, even after you take steps to prevent them.  This condition was passed to you by your parents and you want to have kids. Get help right away if:  Your mouth, tongue, or lips get very swollen.  You have trouble breathing.  You have trouble swallowing.  You pass out (faint). This information is not intended to replace advice given to you by your health care provider. Make sure you discuss any questions you have with your health care provider. Document Released: 06/10/2009 Document Revised: 01/22/2016 Document Reviewed: 12/31/2015 Elsevier Interactive Patient Education  2017 Elsevier  Inc.  Epinephrine injection (Auto-injector) What is this medicine? EPINEPHRINE (ep i NEF rin) is used for the emergency treatment of severe allergic reactions. You should keep this medicine with you at all times. This medicine may be used for other purposes; ask your health care provider or pharmacist if you have questions. COMMON BRAND NAME(S): Adrenaclick, Auvi-Q, epinephrinesnap, epinephrinesnap-v, EpiPen, EPIsnap Epinephrine, SYMJEPI, Twinject What should I tell my health care provider before I take this medicine? They need to know if you have any of the following conditions: -diabetes -heart disease -high blood pressure -lung or breathing disease, like asthma -Parkinson's disease -thyroid disease -an unusual or allergic reaction to epinephrine, sulfites, other medicines, foods, dyes, or preservatives -pregnant or trying to get pregnant -breast-feeding How should I use this medicine? This medicine is for injection into the outer thigh. Your doctor or health care professional will instruct you on the proper use of the device during an emergency. Read all directions carefully and make sure you understand them. Do not use more often than directed. Talk to your pediatrician regarding the use of this medicine in children. Special care may be needed. This drug is commonly used in children. A special device is available for use in children. If you are giving this medicine to a young child, hold their leg firmly in place before and during the injection to prevent injury. Overdosage: If you think you have taken too much of this medicine contact a poison control center or emergency room at once. NOTE: This medicine is only  for you. Do not share this medicine with others. What if I miss a dose? This does not apply. You should only use this medicine for an allergic reaction. What may interact with this medicine? This medicine is only used during an emergency. Significant drug interactions are not  likely during emergency use. This list may not describe all possible interactions. Give your health care provider a list of all the medicines, herbs, non-prescription drugs, or dietary supplements you use. Also tell them if you smoke, drink alcohol, or use illegal drugs. Some items may interact with your medicine. What should I watch for while using this medicine? Keep this medicine ready for use in the case of a severe allergic reaction. Make sure that you have the phone number of your doctor or health care professional and local hospital ready. Remember to check the expiration date of your medicine regularly. You may need to have additional units of this medicine with you at work, school, or other places. Talk to your doctor or health care professional about your need for extra units. Some emergencies may require an additional dose. Check with your doctor or a health care professional before using an extra dose. After use, go to the nearest hospital or call 911. Avoid physical activity. Make sure the treating health care professional knows you have received an injection of this medicine. You will receive additional instructions on what to do during and after use of this medicine before a medical emergency occurs. What side effects may I notice from receiving this medicine? Side effects that you should report to your doctor or health care professional as soon as possible: -allergic reactions like skin rash, itching or hives, swelling of the face, lips, or tongue -breathing problems -chest pain -fast, irregular heartbeat -pain, tingling, numbness in the hands or feet -pain, redness, or irritation at site where injected -vomiting Side effects that usually do not require medical attention (report to your doctor or health care professional if they continue or are bothersome): -anxious -dizziness -dry mouth -headache -increased sweating -nausea -unusually weak or tired This list may not describe  all possible side effects. Call your doctor for medical advice about side effects. You may report side effects to FDA at 1-800-FDA-1088. Where should I keep my medicine? Keep out of the reach of children. Store at room temperature between 15 and 30 degrees C (59 and 86 degrees F). Protect from light and heat. The solution should be clear in color. If the solution is discolored or contains particles it must be replaced. Throw away any unused medicine after the expiration date. Ask your doctor or pharmacist about proper disposal of the injector if it is expired or has been used. Always replace your auto-injector before it expires. NOTE: This sheet is a summary. It may not cover all possible information. If you have questions about this medicine, talk to your doctor, pharmacist, or health care provider.  2018 Elsevier/Gold Standard (2014-11-26 12:24:50)

## 2016-10-22 LAB — COMPLEMENT, TOTAL: Compl, Total (CH50): >60 U/mL — ABNORMAL HIGH (ref 31–60)

## 2016-10-27 LAB — COMPLEMENT COMPONENT C1Q: Complement C1Q: 8.9 mg/dL — ABNORMAL HIGH (ref 5.0–8.6)

## 2016-11-02 DIAGNOSIS — J300 Vasomotor rhinitis: Secondary | ICD-10-CM | POA: Diagnosis not present

## 2016-11-02 DIAGNOSIS — T783XXD Angioneurotic edema, subsequent encounter: Secondary | ICD-10-CM | POA: Diagnosis not present

## 2016-11-02 NOTE — Addendum Note (Signed)
Addended by: Rodolph Bong on: 11/02/2016 03:07 PM   Modules accepted: Orders

## 2016-11-17 ENCOUNTER — Ambulatory Visit: Payer: BLUE CROSS/BLUE SHIELD | Admitting: Family Medicine

## 2016-11-20 ENCOUNTER — Telehealth: Payer: Self-pay | Admitting: Family Medicine

## 2016-11-20 NOTE — Telephone Encounter (Signed)
[facility] a [facility] today.  They have been trying to reach you and have been unable to schedule an appointment.  Please call them at [phone] to schedule an appointment.

## 2016-11-20 NOTE — Telephone Encounter (Signed)
Message left on vm 

## 2016-11-24 ENCOUNTER — Encounter: Payer: Self-pay | Admitting: Family Medicine

## 2016-11-24 ENCOUNTER — Ambulatory Visit (INDEPENDENT_AMBULATORY_CARE_PROVIDER_SITE_OTHER): Payer: BLUE CROSS/BLUE SHIELD | Admitting: Family Medicine

## 2016-11-24 VITALS — BP 149/85 | HR 89 | Wt 286.0 lb

## 2016-11-24 DIAGNOSIS — T783XXA Angioneurotic edema, initial encounter: Secondary | ICD-10-CM

## 2016-11-24 DIAGNOSIS — G4733 Obstructive sleep apnea (adult) (pediatric): Secondary | ICD-10-CM

## 2016-11-24 DIAGNOSIS — R0683 Snoring: Secondary | ICD-10-CM | POA: Diagnosis not present

## 2016-11-24 DIAGNOSIS — I10 Essential (primary) hypertension: Secondary | ICD-10-CM | POA: Diagnosis not present

## 2016-11-24 HISTORY — DX: Obstructive sleep apnea (adult) (pediatric): G47.33

## 2016-11-24 HISTORY — DX: Essential (primary) hypertension: I10

## 2016-11-24 MED ORDER — HYDROCHLOROTHIAZIDE 25 MG PO TABS: 25.0000 mg | ORAL_TABLET | Freq: Every day | ORAL | 1 refills | Status: DC

## 2016-11-24 NOTE — Progress Notes (Signed)
Dillon Robinson is a 31 y.o. male who presents to [facility] today for follow-up hypertension and obesity and possible sleep apnea and angioedema.  Hypertension: Patient has had elevated blood pressure at several visits. He's been measuring his blood pressure at home and finds that his systolic ranges usually in the 140s. He denies chest pain palpitations or shortness of breath. He does not take any medications.  Sleep apnea: Patient notes that he snores feels tired and has observed apneic episodes. He suspects that he has sleep apnea but has never had it tested.  Obesity: Patient has struggled to lose weight for most of his adult life. He is interested in losing weight. He does not exercise regularly nor does he check his calories.  Angioedema: Patient has been seen several times for angioedema. He was seen by allergy/immunology negative allergy testing with no obvious culprit. He is currently not having any angioedema and does not take any medication. He feels well and does not wish to proceed with further workup at this time.  Past Medical History:  Diagnosis Date  . Angioedema 10/21/2016  . HTN (hypertension) 11/24/2016  . Vitamin D deficiency 10/21/2016   No past surgical history on file. Social History  Substance Use Topics  . Smoking status: Never Smoker  . Smokeless tobacco: Never Used  . Alcohol use Yes   family history includes Gout in his father; Heart failure in his father.  ROS as above:  Medications: Current Outpatient Prescriptions  Medication Sig Dispense Refill  . EPINEPHrine 0.3 mg/0.3 mL IJ SOAJ injection Inject 0.3 mLs (0.3 mg total) into the muscle once as needed. Severe lip, tongue or throat swelling 2 Device 1  . hydrochlorothiazide (HYDRODIURIL) 25 MG tablet Take 1 tablet (25 mg total) by mouth daily. 30 tablet 1   No current  facility-administered medications for this visit.    No Known Allergies  Health Maintenance Health Maintenance  Topic Date Due  . HIV Screening  11/06/1998  . TETANUS/TDAP  11/06/2002  . INFLUENZA VACCINE  02/03/2017     Exam:  BP (!) 149/85   Pulse 89   Wt 286 lb (129.7 kg)   BMI 36.72 kg/m  Gen: Well NAD obese HEENT: EOMI,  MMM large neck. No lip or tongue swelling Lungs: Normal work of breathing. CTABL Heart: RRR no MRG Abd: NABS, Soft. Nondistended, Nontender Exts: Brisk capillary refill, warm and well perfused.   STOP BANG: Snore:     Yes Tired:     Yes Observed stop breathing:  Yes Hypertension:   Yes  BMI >35:   Yes Age >50:   No Neck > 16 inches:  Yes Male gender:   Yes ------------------------------------------ Total:     7/8   No results found for this or any previous visit (from the past 72 hour(s)). No results found.    Assessment and Plan: 31 y.o. male with  Hypertension: Blood pressure consistently elevated. Plan to start hydrochlorothiazide. Avoid ACE inhibitor due to history of angioedema. Recheck in one month.  Potential sleep apnea: Plan for home sleep study.  Angioedema: Not currently active. Continue to follow.  Morbid obesity: Central medical problem. Had a lengthy discussion about lifestyle modification. Plan for increasing activity level. Plan for 20 minutes of moderate exercise 5 days a week along with calorie counting and low carbohydrate diet. Plan to follow this up in a month.   Orders Placed This Encounter  Procedures  .  Home sleep test    Standing Status:   Future    Standing Expiration Date:   11/24/2017    Order Specific Question:   Where should this test be performed:    Answer:   [facility]   Meds ordered this encounter  Medications  . hydrochlorothiazide (HYDRODIURIL) 25 MG tablet    Sig: Take 1 tablet (25 mg total) by mouth daily.    Dispense:  30 tablet    Refill:  1     Discussed warning signs  or symptoms. Please see discharge instructions. Patient expresses understanding.  I spent 40 minutes with this patient, greater than 50% was face-to-face time counseling regarding the above diagnosis.

## 2016-11-24 NOTE — Patient Instructions (Signed)
Thank you for coming in today. You should hear about the home sleep study soon.   Start HCTZ daily.   Work on a reduced calorie diet. Myfitensspal is a good free app.  Fitbit also has a good app.   Use a food scale and measuring cups when logging food.   Try to exercise moderately for 20 mins 4-5 x a week.   A bioimedience scale can be helpful for not measuring body water.    Recheck in 1 month.   Hydrochlorothiazide, HCTZ capsules or tablets What is this medicine? HYDROCHLOROTHIAZIDE (hye droe klor oh THYE a zide) is a diuretic. It increases the amount of urine passed, which causes the body to lose salt and water. This medicine is used to treat high blood pressure. It is also reduces the swelling and water retention caused by various medical conditions, such as heart, liver, or kidney disease. This medicine may be used for other purposes; ask your health care provider or pharmacist if you have questions. COMMON BRAND NAME(S): Esidrix, Ezide, HydroDIURIL, Microzide, Oretic, Zide What should I tell my health care provider before I take this medicine? They need to know if you have any of these conditions: -diabetes -gout -immune system problems, like lupus -kidney disease or kidney stones -liver disease -pancreatitis -small amount of urine or difficulty passing urine -an unusual or allergic reaction to hydrochlorothiazide, sulfa drugs, other medicines, foods, dyes, or preservatives -pregnant or trying to get pregnant -breast-feeding How should I use this medicine? Take this medicine by mouth with a glass of water. Follow the directions on the prescription label. Take your medicine at regular intervals. Remember that you will need to pass urine frequently after taking this medicine. Do not take your doses at a time of day that will cause you problems. Do not stop taking your medicine unless your doctor tells you to. Talk to your pediatrician regarding the use of this medicine in  children. Special care may be needed. Overdosage: If you think you have taken too much of this medicine contact a poison control center or emergency room at once. NOTE: This medicine is only for you. Do not share this medicine with others. What if I miss a dose? If you miss a dose, take it as soon as you can. If it is almost time for your next dose, take only that dose. Do not take double or extra doses. What may interact with this medicine? -cholestyramine -colestipol -digoxin -dofetilide -lithium -medicines for blood pressure -medicines for diabetes -medicines that relax muscles for surgery -other diuretics -steroid medicines like prednisone or cortisone This list may not describe all possible interactions. Give your health care provider a list of all the medicines, herbs, non-prescription drugs, or dietary supplements you use. Also tell them if you smoke, drink alcohol, or use illegal drugs. Some items may interact with your medicine. What should I watch for while using this medicine? Visit your doctor or health care professional for regular checks on your progress. Check your blood pressure as directed. Ask your doctor or health care professional what your blood pressure should be and when you should contact him or her. You may need to be on a special diet while taking this medicine. Ask your doctor. Check with your doctor or health care professional if you get an attack of severe diarrhea, nausea and vomiting, or if you sweat a lot. The loss of too much body fluid can make it dangerous for you to take this medicine. You may get  drowsy or dizzy. Do not drive, use machinery, or do anything that needs mental alertness until you know how this medicine affects you. Do not stand or sit up quickly, especially if you are an older patient. This reduces the risk of dizzy or fainting spells. Alcohol may interfere with the effect of this medicine. Avoid alcoholic drinks. This medicine may affect your  blood sugar level. If you have diabetes, check with your doctor or health care professional before changing the dose of your diabetic medicine. This medicine can make you more sensitive to the sun. Keep out of the sun. If you cannot avoid being in the sun, wear protective clothing and use sunscreen. Do not use sun lamps or tanning beds/booths. What side effects may I notice from receiving this medicine? Side effects that you should report to your doctor or health care professional as soon as possible: -allergic reactions such as skin rash or itching, hives, swelling of the lips, mouth, tongue, or throat -changes in vision -chest pain -eye pain -fast or irregular heartbeat -feeling faint or lightheaded, falls -gout attack -muscle pain or cramps -pain or difficulty when passing urine -pain, tingling, numbness in the hands or feet -redness, blistering, peeling or loosening of the skin, including inside the mouth -unusually weak or tired Side effects that usually do not require medical attention (report to your doctor or health care professional if they continue or are bothersome): -change in sex drive or performance -dry mouth -headache -stomach upset This list may not describe all possible side effects. Call your doctor for medical advice about side effects. You may report side effects to FDA at 1-800-FDA-1088. Where should I keep my medicine? Keep out of the reach of children. Store at room temperature between 15 and 30 degrees C (59 and 86 degrees F). Do not freeze. Protect from light and moisture. Keep container closed tightly. Throw away any unused medicine after the expiration date. NOTE: This sheet is a summary. It may not cover all possible information. If you have questions about this medicine, talk to your doctor, pharmacist, or health care provider.  2018 Elsevier/Gold Standard (2010-02-14 12:57:37)

## 2016-11-26 ENCOUNTER — Encounter: Payer: Self-pay | Admitting: Family Medicine

## 2016-12-24 ENCOUNTER — Ambulatory Visit (INDEPENDENT_AMBULATORY_CARE_PROVIDER_SITE_OTHER): Payer: BLUE CROSS/BLUE SHIELD | Admitting: Family Medicine

## 2016-12-24 VITALS — BP 146/90 | HR 92 | Wt 288.0 lb

## 2016-12-24 DIAGNOSIS — I10 Essential (primary) hypertension: Secondary | ICD-10-CM | POA: Diagnosis not present

## 2016-12-24 NOTE — Progress Notes (Signed)
       Lucy ChrisSteven Mackie is a 31 y.o. male who [facility] today for follow up HTN.   [name] has been taking hydrochlorothiazide now for about a month. He notes that he has trouble remembering to take the medicine consistently. He forgot to take the medication today. He denies chest pain palpitations or shortness of breath. He notes the medicine seems to have no side effects.   Past Medical History:  Diagnosis Date  . Angioedema 10/21/2016  . HTN (hypertension) 11/24/2016  . Vitamin D deficiency 10/21/2016   No past surgical history on file. Social History  Substance Use Topics  . Smoking status: Never Smoker  . Smokeless tobacco: Never Used  . Alcohol use Yes   family history includes Gout in his father; Heart failure in his father.  ROS as above:  Medications: Current Outpatient Prescriptions  Medication Sig Dispense Refill  . EPINEPHrine 0.3 mg/0.3 mL IJ SOAJ injection Inject 0.3 mLs (0.3 mg total) into the muscle once as needed. Severe lip, tongue or throat swelling 2 Device 1  . hydrochlorothiazide (HYDRODIURIL) 25 MG tablet Take 1 tablet (25 mg total) by mouth daily. 30 tablet 1   No current facility-administered medications for this visit.    No Known Allergies  Health Maintenance Health Maintenance  Topic Date Due  . HIV Screening  11/06/1998  . TETANUS/TDAP  11/06/2002  . INFLUENZA VACCINE  02/03/2017     Exam:  BP (!) 146/90   Pulse 92   Wt 288 lb (130.6 kg)   BMI 36.98 kg/m   Wt Readings from Last 5 Encounters:  12/24/16 288 lb (130.6 kg)  11/24/16 286 lb (129.7 kg)  10/21/16 284 lb (128.8 kg)  10/20/16 283 lb (128.4 kg)  12/05/15 262 lb (118.8 kg)    Gen: Well NAD HEENT: EOMI,  MMM Lungs: Normal work of breathing. CTABL Heart: RRR no MRG Abd: NABS, Soft. Nondistended, Nontender Exts: Brisk capillary refill, warm and well  perfused.    No results found for this or any previous visit (from the past 72 hour(s)). No results found.    Assessment and Plan: 31 y.o. male with  Hypertension not at goal. Patient forgot his medication today. Recommend a pillbox to help him be more compliant with the drug therapy. Recheck with a nurse visit in a week or 2. At that time will check basic metabolic panel that was ordered today.  Obesity: Patient is not yet implemented lifestyle modification. He will think about it.   No orders of the defined types were placed in this encounter.  No orders of the defined types were placed in this encounter.    Discussed warning signs or symptoms. Please see discharge instructions. Patient expresses understanding.

## 2016-12-24 NOTE — Patient Instructions (Addendum)
Thank you for coming in today. Continue to take HCTZ daily.  Return in 1-2 weeks for nurse visit for blood pressure check on medicine.  Get a pill box and leave the medicine somewhere you can remember to take it.  On nurse visit if Blood pressure is ok we will get labs and recheck in months to 1 year.   Pill BOX

## 2017-01-01 ENCOUNTER — Ambulatory Visit (INDEPENDENT_AMBULATORY_CARE_PROVIDER_SITE_OTHER): Payer: BLUE CROSS/BLUE SHIELD | Admitting: Family Medicine

## 2017-01-01 VITALS — BP 144/91 | HR 96 | Wt 289.0 lb

## 2017-01-01 DIAGNOSIS — I10 Essential (primary) hypertension: Secondary | ICD-10-CM | POA: Diagnosis not present

## 2017-01-01 MED ORDER — AMLODIPINE BESYLATE 10 MG PO TABS: 10.0000 mg | ORAL_TABLET | Freq: Every day | ORAL | 0 refills | Status: DC

## 2017-01-01 NOTE — Progress Notes (Signed)
       Lucy ChrisSteven Erber is a 31 y.o. male who [facility] today for follow up HTN.  Pt continue to take HCTZ daily. He feels well.    Past Medical History:  Diagnosis Date  . Angioedema 10/21/2016  . HTN (hypertension) 11/24/2016  . Vitamin D deficiency 10/21/2016   No past surgical history on file. Social History  Substance Use Topics  . Smoking status: Never Smoker  . Smokeless tobacco: Never Used  . Alcohol use Yes   family history includes Gout in his father; Heart failure in his father.  ROS as above:  Medications: Current Outpatient Prescriptions  Medication Sig Dispense Refill  . amLODipine (NORVASC) 10 MG tablet Take 1 tablet (10 mg total) by mouth daily. 90 tablet 0  . EPINEPHrine 0.3 mg/0.3 mL IJ SOAJ injection Inject 0.3 mLs (0.3 mg total) into the muscle once as needed. Severe lip, tongue or throat swelling 2 Device 1  . hydrochlorothiazide (HYDRODIURIL) 25 MG tablet Take 1 tablet (25 mg total) by mouth daily. 30 tablet 1   No current facility-administered medications for this visit.    No Known Allergies  Health Maintenance Health Maintenance  Topic Date Due  . HIV Screening  11/06/1998  . TETANUS/TDAP  11/06/2002  . INFLUENZA VACCINE  02/03/2017     Exam:  BP (!) 144/91   Pulse 96   Wt 289 lb (131.1 kg)   SpO2 98%   BMI 37.11 kg/m  Gen: Well NAD HEENT: EOMI,  MMM Lungs: Normal work of breathing. CTABL Heart: RRR no MRG Abd: NABS, Soft. Nondistended, Nontender Exts: Brisk capillary refill, warm and well perfused.    No results found for this or any previous visit (from the past 72 hour(s)). No results found.    Assessment and Plan: 31 y.o. male with BP still elevated.  Add Amlodipine.  Check BMP.  Recheck with me in 1 month.    No orders of the defined types were placed in this encounter.  Meds ordered this  encounter  Medications  . amLODipine (NORVASC) 10 MG tablet    Sig: Take 1 tablet (10 mg total) by mouth daily.    Dispense:  90 tablet    Refill:  0     Discussed warning signs or symptoms. Please see discharge instructions. Patient expresses understanding.

## 2017-01-01 NOTE — Patient Instructions (Signed)
Thank you for coming in today. Get labs now.  Continue HCTZ.  Add amlodipine today.  Recheck in 1 month.   Amlodipine tablets What is this medicine? AMLODIPINE (am LOE di peen) is a calcium-channel blocker. It affects the amount of calcium found in your heart and muscle cells. This relaxes your blood vessels, which can reduce the amount of work the heart has to do. This medicine is used to lower high blood pressure. It is also used to prevent chest pain. This medicine may be used for other purposes; ask your health care provider or pharmacist if you have questions. COMMON BRAND NAME(S): Norvasc What should I tell my health care provider before I take this medicine? They need to know if you have any of these conditions: -heart problems like heart failure or aortic stenosis -liver disease -an unusual or allergic reaction to amlodipine, other medicines, foods, dyes, or preservatives -pregnant or trying to get pregnant -breast-feeding How should I use this medicine? Take this medicine by mouth with a glass of water. Follow the directions on the prescription label. Take your medicine at regular intervals. Do not take more medicine than directed. Talk to your pediatrician regarding the use of this medicine in children. Special care may be needed. This medicine has been used in children as young as 6. Persons over 33 years old may have a stronger reaction to this medicine and need smaller doses. Overdosage: If you think you have taken too much of this medicine contact a poison control center or emergency room at once. NOTE: This medicine is only for you. Do not share this medicine with others. What if I miss a dose? If you miss a dose, take it as soon as you can. If it is almost time for your next dose, take only that dose. Do not take double or extra doses. What may interact with this medicine? -herbal or dietary supplements -local or general anesthetics -medicines for high blood  pressure -medicines for prostate problems -rifampin This list may not describe all possible interactions. Give your health care provider a list of all the medicines, herbs, non-prescription drugs, or dietary supplements you use. Also tell them if you smoke, drink alcohol, or use illegal drugs. Some items may interact with your medicine. What should I watch for while using this medicine? Visit your doctor or health care professional for regular check ups. Check your blood pressure and pulse rate regularly. Ask your health care professional what your blood pressure and pulse rate should be, and when you should contact him or her. This medicine may make you feel confused, dizzy or lightheaded. Do not drive, use machinery, or do anything that needs mental alertness until you know how this medicine affects you. To reduce the risk of dizzy or fainting spells, do not sit or stand up quickly, especially if you are an older patient. Avoid alcoholic drinks; they can make you more dizzy. Do not suddenly stop taking amlodipine. Ask your doctor or health care professional how you can gradually reduce the dose. What side effects may I notice from receiving this medicine? Side effects that you should report to your doctor or health care professional as soon as possible: -allergic reactions like skin rash, itching or hives, swelling of the face, lips, or tongue -breathing problems -changes in vision or hearing -chest pain -fast, irregular heartbeat -swelling of legs or ankles Side effects that usually do not require medical attention (report to your doctor or health care professional if they continue or  are bothersome): -dry mouth -facial flushing -nausea, vomiting -stomach gas, pain -tired, weak -trouble sleeping This list may not describe all possible side effects. Call your doctor for medical advice about side effects. You may report side effects to FDA at 1-800-FDA-1088. Where should I keep my  medicine? Keep out of the reach of children. Store at room temperature between 59 and 86 degrees F (15 and 30 degrees C). Protect from light. Keep container tightly closed. Throw away any unused medicine after the expiration date. NOTE: This sheet is a summary. It may not cover all possible information. If you have questions about this medicine, talk to your doctor, pharmacist, or health care provider.  2018 Elsevier/Gold Standard (2012-05-20 11:40:58)

## 2017-01-02 LAB — BASIC METABOLIC PANEL WITH GFR
BUN: 18 mg/dL (ref 7–25)
CHLORIDE: 104 mmol/L (ref 98–110)
CO2: 27 mmol/L (ref 20–31)
Calcium: 9.8 mg/dL (ref 8.6–10.3)
Creat: 1.29 mg/dL (ref 0.60–1.35)
GFR, EST NON AFRICAN AMERICAN: 72 mL/min (ref >=60)
GFR, Est African American: 84 mL/min (ref >=60)
GLUCOSE: 97 mg/dL (ref 65–99)
POTASSIUM: 3.8 mmol/L (ref 3.5–5.3)
Sodium: 141 mmol/L (ref 135–146)

## 2017-01-04 MED ORDER — HYDROCHLOROTHIAZIDE 25 MG PO TABS: 25.0000 mg | ORAL_TABLET | Freq: Every day | ORAL | 1 refills | Status: DC

## 2017-01-04 NOTE — Addendum Note (Signed)
Addended by: Rodolph BongOREY, Tomma Ehinger S on: 01/04/2017 07:35 AM   Modules accepted: Orders

## 2017-01-12 ENCOUNTER — Ambulatory Visit (HOSPITAL_BASED_OUTPATIENT_CLINIC_OR_DEPARTMENT_OTHER): Payer: BLUE CROSS/BLUE SHIELD | Attending: Family Medicine | Admitting: Internal Medicine

## 2017-01-12 DIAGNOSIS — G4733 Obstructive sleep apnea (adult) (pediatric): Secondary | ICD-10-CM | POA: Diagnosis not present

## 2017-01-12 DIAGNOSIS — I10 Essential (primary) hypertension: Secondary | ICD-10-CM | POA: Diagnosis not present

## 2017-01-12 DIAGNOSIS — R0902 Hypoxemia: Secondary | ICD-10-CM | POA: Diagnosis not present

## 2017-01-16 DIAGNOSIS — I10 Essential (primary) hypertension: Secondary | ICD-10-CM

## 2017-01-16 NOTE — Procedures (Signed)
    Patient Name: Dillon Robinson, Dillon Robinson Study Date: 01/12/2017 Gender: Male D.O.B: 1986/06/19 Age (years): 31 Referring Provider: Rodolph BongEvan S Robinson Height (inches): 74 Interpreting Physician: Dillon Duhamellinton Teauna Dubach MD, ABSM Weight (lbs): 285 RPSGT: Dillon Robinson, Dillon Robinson BMI: 37 MRN: [id number] Neck Size: 18.00 CLINICAL INFORMATION Sleep Study Type: unattended HST  Indication for sleep study: obstructive sleep apnea  Epworth Sleepiness Score: 9  SLEEP STUDY TECHNIQUE A multi-channel overnight portable sleep study was performed. The channels recorded were: nasal airflow, thoracic respiratory movement, and oxygen saturation with a pulse oximetry. Snoring was also monitored.  MEDICATIONS Patient self administered medications include: none reported during study.  SLEEP ARCHITECTURE Patient was studied for 413.0 minutes. The sleep efficiency was 98.3 % and the patient was supine for 25.5%. The arousal index was 0.0 per hour.  RESPIRATORY PARAMETERS The overall AHI was 39.8 per hour, with a central apnea index of 0.0 per hour.  The oxygen nadir was 78% during sleep.  CARDIAC DATA Mean heart rate during sleep was 73.1 bpm.  IMPRESSIONS - Severe obstructive sleep apnea occurred during this study (AHI = 39.8/h). - No significant central sleep apnea occurred during this study (CAI = 0.0/h). - Severe oxygen desaturation was noted during this study (Min O2 = 78%, Mean 92%). - Patient snored .  DIAGNOSIS - Obstructive Sleep Apnea (327.23 [G47.33 ICD-10]) - Nocturnal Hypoxemia (327.26 [G47.36 ICD-10])  RECOMMENDATIONS - CPAP titration to establish therapy. Other options would be based on clinical judgment. - Positional therapy avoiding supine position during sleep. - Avoid alcohol, sedatives and other CNS depressants that may worsen sleep apnea and disrupt normal sleep architecture. - Sleep hygiene should be reviewed to assess factors that may improve sleep quality. - Weight management and regular  exercise should be initiated or continued.  [Electronically signed] 01/16/2017 11:12 AM  Dillon Duhamellinton Verlie Liotta MD, ABSM Diplomate, American Board of Sleep Medicine   NPI: [id number]  Waymon BudgeYOUNG,Annalicia Renfrew D Diplomate, American Board of Sleep Medicine  ELECTRONICALLY SIGNED ON:  01/16/2017, 11:09 AM [facility] PH: [phone]   FX: [phone] ACCREDITED BY THE AMERICAN ACADEMY OF SLEEP MEDICINE

## 2017-01-18 ENCOUNTER — Encounter: Payer: Self-pay | Admitting: Family Medicine

## 2017-01-18 ENCOUNTER — Telehealth: Payer: Self-pay | Admitting: Family Medicine

## 2017-01-18 DIAGNOSIS — G4733 Obstructive sleep apnea (adult) (pediatric): Secondary | ICD-10-CM

## 2017-01-18 MED ORDER — AMBULATORY NON FORMULARY MEDICATION: 0 refills | Status: DC

## 2017-01-18 NOTE — Telephone Encounter (Signed)
Orders sent to [facility]

## 2017-01-18 NOTE — Telephone Encounter (Signed)
Sleep study shows severe sleep apnea.  I have ordered a CPAP machine.  You should hear from the sleep apnea company soon.  Let me know if you do not hear from them.  Also we need to recheck after being on the machine for 1 month.   Weight loss will help.

## 2017-01-25 ENCOUNTER — Ambulatory Visit: Payer: BLUE CROSS/BLUE SHIELD | Admitting: Family Medicine

## 2017-01-28 ENCOUNTER — Ambulatory Visit (INDEPENDENT_AMBULATORY_CARE_PROVIDER_SITE_OTHER): Payer: BLUE CROSS/BLUE SHIELD | Admitting: Family Medicine

## 2017-01-28 ENCOUNTER — Encounter: Payer: Self-pay | Admitting: Family Medicine

## 2017-01-28 VITALS — BP 134/84 | HR 87 | Wt 286.0 lb

## 2017-01-28 DIAGNOSIS — G4733 Obstructive sleep apnea (adult) (pediatric): Secondary | ICD-10-CM

## 2017-01-28 DIAGNOSIS — F902 Attention-deficit hyperactivity disorder, combined type: Secondary | ICD-10-CM | POA: Diagnosis not present

## 2017-01-28 DIAGNOSIS — I10 Essential (primary) hypertension: Secondary | ICD-10-CM | POA: Diagnosis not present

## 2017-01-28 DIAGNOSIS — F909 Attention-deficit hyperactivity disorder, unspecified type: Secondary | ICD-10-CM | POA: Insufficient documentation

## 2017-01-28 NOTE — Progress Notes (Signed)
Dillon Robinson is a 31 y.o. male who [facility] today for follow-up hypertension and sleep apnea.  Hypertension: Patient takes hydrochlorothiazide and was started on amlodipine last month. Is feeling well and notes blood pressures better controlled. No chest pain palpitations or shortness of breath.  Sleep apnea: Patient was recently diagnosed with sleep apnea [facility] sleep study. He has not received his CPAP machine yet as he was told it was going to be $750. He would like to try different company.  ADHD: Patient was seen by an independent psychiatrist for ADHD. He was started on Wellbutrin which has not helped much.   Past Medical History:  Diagnosis Date  . Angioedema 10/21/2016  . HTN (hypertension) 11/24/2016  . OSA (obstructive sleep apnea) 11/24/2016  . Vitamin D deficiency 10/21/2016   No past surgical history on file. Social History  Substance Use Topics  . Smoking status: Never Smoker  . Smokeless tobacco: Never Used  . Alcohol use Yes   family history includes Gout in his father; Heart failure in his father.  ROS as above:  Medications: Current Outpatient Prescriptions  Medication Sig Dispense Refill  . AMBULATORY NON FORMULARY MEDICATION Continuous positive airway pressure (CPAP) machine auto-titrate from 4-20 cm of H2O pressure, with all supplemental supplies as needed. 1 each 0  . amLODipine (NORVASC) 10 MG tablet Take 1 tablet (10 mg total) by mouth daily. 90 tablet 0  . buPROPion (WELLBUTRIN XL) 150 MG 24 hr tablet Take 1 tablet by mouth daily.  0  . EPINEPHrine 0.3 mg/0.3 mL IJ SOAJ injection Inject 0.3 mLs (0.3 mg total) into the muscle once as needed. Severe lip, tongue or throat swelling 2 Device 1  . hydrochlorothiazide (HYDRODIURIL) 25 MG tablet Take 1 tablet (25 mg total) by mouth daily. 90 tablet 1   No current  facility-administered medications for this visit.    No Known Allergies  Health Maintenance Health Maintenance  Topic Date Due  . HIV Screening  11/06/1998  . TETANUS/TDAP  11/06/2002  . INFLUENZA VACCINE  02/03/2017     Exam:  BP 134/84   Pulse 87   Wt 286 lb (129.7 kg)   SpO2 95%   BMI 36.72 kg/m   Wt Readings from Last 10 Encounters:  01/28/17 286 lb (129.7 kg)  01/12/17 285 lb (129.3 kg)  01/01/17 289 lb (131.1 kg)  12/24/16 288 lb (130.6 kg)  11/24/16 286 lb (129.7 kg)  10/21/16 284 lb (128.8 kg)  10/20/16 283 lb (128.4 kg)  12/05/15 262 lb (118.8 kg)  10/22/15 269 lb (122 kg)  09/03/15 262 lb (118.8 kg)    Gen: Well NAD HEENT: EOMI,  MMM large neck Lungs: Normal work of breathing. CTABL Heart: RRR no MRG Abd: NABS, Soft. Nondistended, Nontender Exts: Brisk capillary refill, warm and well perfused.    No results found for this or any previous visit (from the past 72 hour(s)). No results found.    Assessment and Plan: 31 y.o. male with  Hypertension: Doing well. Continue current medication management. Hopefully this will get better with better control of sleep apnea.  Sleep apnea: We'll get the process going on CPAP machine. Hopefully it will not be $750. Follow-up with me one month after starting CPAP  ADHD: Initial evaluation with psychiatry. Continue management. I'm happy to take over when stable.   No orders of the defined types were placed in this encounter.  Meds ordered  this encounter  Medications  . buPROPion (WELLBUTRIN XL) 150 MG 24 hr tablet    Sig: Take 1 tablet by mouth daily.    Refill:  0     Discussed warning signs or symptoms. Please see discharge instructions. Patient expresses understanding.

## 2017-01-28 NOTE — Patient Instructions (Addendum)
Thank you for coming in today.  You should hear about CPAP   Follow up with me in 1 month after starting CPAP.   Work on weight loss.   Try to get <2000kcak per day or less.     Sleep Apnea Sleep apnea is a condition in which breathing pauses or becomes shallow during sleep. Episodes of sleep apnea usually last 10 seconds or longer, and they may occur as many as 20 times an hour. Sleep apnea disrupts your sleep and keeps your body from getting the rest that it needs. This condition can increase your risk of certain health problems, including:  Heart attack.  Stroke.  Obesity.  Diabetes.  Heart failure.  Irregular heartbeat.  There are three kinds of sleep apnea:  Obstructive sleep apnea. This kind is caused by a blocked or collapsed airway.  Central sleep apnea. This kind happens when the part of the brain that controls breathing does not send the correct signals to the muscles that control breathing.  Mixed sleep apnea. This is a combination of obstructive and central sleep apnea.  What are the causes? The most common cause of this condition is a collapsed or blocked airway. An airway can collapse or become blocked if:  Your throat muscles are abnormally relaxed.  Your tongue and tonsils are larger than normal.  You are overweight.  Your airway is smaller than normal.  What increases the risk? This condition is more likely to develop in people who:  Are overweight.  Smoke.  Have a smaller than normal airway.  Are elderly.  Are male.  Drink alcohol.  Take sedatives or tranquilizers.  Have a family history of sleep apnea.  What are the signs or symptoms? Symptoms of this condition include:  Trouble staying asleep.  Daytime sleepiness and tiredness.  Irritability.  Loud snoring.  Morning headaches.  Trouble concentrating.  Forgetfulness.  Decreased interest in sex.  Unexplained sleepiness.  Mood swings.  Personality  changes.  Feelings of depression.  Waking up often during the night to urinate.  Dry mouth.  Sore throat.  How is this diagnosed? This condition may be diagnosed with:  A medical history.  A physical exam.  A series of tests that are done while you are sleeping (sleep study). These tests are usually done in a sleep lab, but they may also be done at home.  How is this treated? Treatment for this condition aims to restore normal breathing and to ease symptoms during sleep. It may involve managing health issues that can affect breathing, such as high blood pressure or obesity. Treatment may include:  Sleeping on your side.  Using a decongestant if you have nasal congestion.  Avoiding the use of depressants, including alcohol, sedatives, and narcotics.  Losing weight if you are overweight.  Making changes to your diet.  Quitting smoking.  Using a device to open your airway while you sleep, such as: ? An oral appliance. This is a custom-made mouthpiece that shifts your lower jaw forward. ? A continuous positive airway pressure (CPAP) device. This device delivers oxygen to your airway through a mask. ? A nasal expiratory positive airway pressure (EPAP) device. This device has valves that you put into each nostril. ? A bi-level positive airway pressure (BPAP) device. This device delivers oxygen to your airway through a mask.  Surgery if other treatments do not work. During surgery, excess tissue is removed to create a wider airway.  It is important to get treatment for sleep apnea.  Without treatment, this condition can lead to:  High blood pressure.  Coronary artery disease.  (Men) An inability to achieve or maintain an erection (impotence).  Reduced thinking abilities.  Follow these instructions at home:  Make any lifestyle changes that your health care provider recommends.  Eat a healthy, well-balanced diet.  Take over-the-counter and prescription medicines only  as told by your health care provider.  Avoid using depressants, including alcohol, sedatives, and narcotics.  Take steps to lose weight if you are overweight.  If you were given a device to open your airway while you sleep, use it only as told by your health care provider.  Do not use any tobacco products, such as cigarettes, chewing tobacco, and e-cigarettes. If you need help quitting, ask your health care provider.  Keep all follow-up visits as told by your health care provider. This is important. Contact a health care provider if:  The device that you received to open your airway during sleep is uncomfortable or does not seem to be working.  Your symptoms do not improve.  Your symptoms get worse. Get help right away if:  You develop chest pain.  You develop shortness of breath.  You develop discomfort in your back, arms, or stomach.  You have trouble speaking.  You have weakness on one side of your body.  You have drooping in your face. These symptoms may represent a serious problem that is an emergency. Do not wait to see if the symptoms will go away. Get medical help right away. Call your local emergency services (911 in the [place] Do not drive yourself to the hospital. This information is not intended to replace advice given to you by your health care provider. Make sure you discuss any questions you have with your health care provider. Document Released: 06/12/2002 Document Revised: 02/16/2016 Document Reviewed: 04/01/2015 Elsevier Interactive Patient Education  [employer]2018 Elsevier Inc.

## 2017-03-10 IMAGING — CR DG HIP (WITH OR WITHOUT PELVIS) 2-3V*R*
3 series · 3 of 3 positions shown · non-contrast
Comparison: CT scan of the abdomen and pelvis of July 25, 2011

CLINICAL DATA: Ten day history of abrupt onset right superior
medial thigh pain which now extends into the anterior lateral 5 ;
symptoms are worsened with walking or going down stairs common no
known injury.

EXAM:
DG HIP (WITH OR WITHOUT PELVIS) 2-3V RIGHT

[pelvis ap]
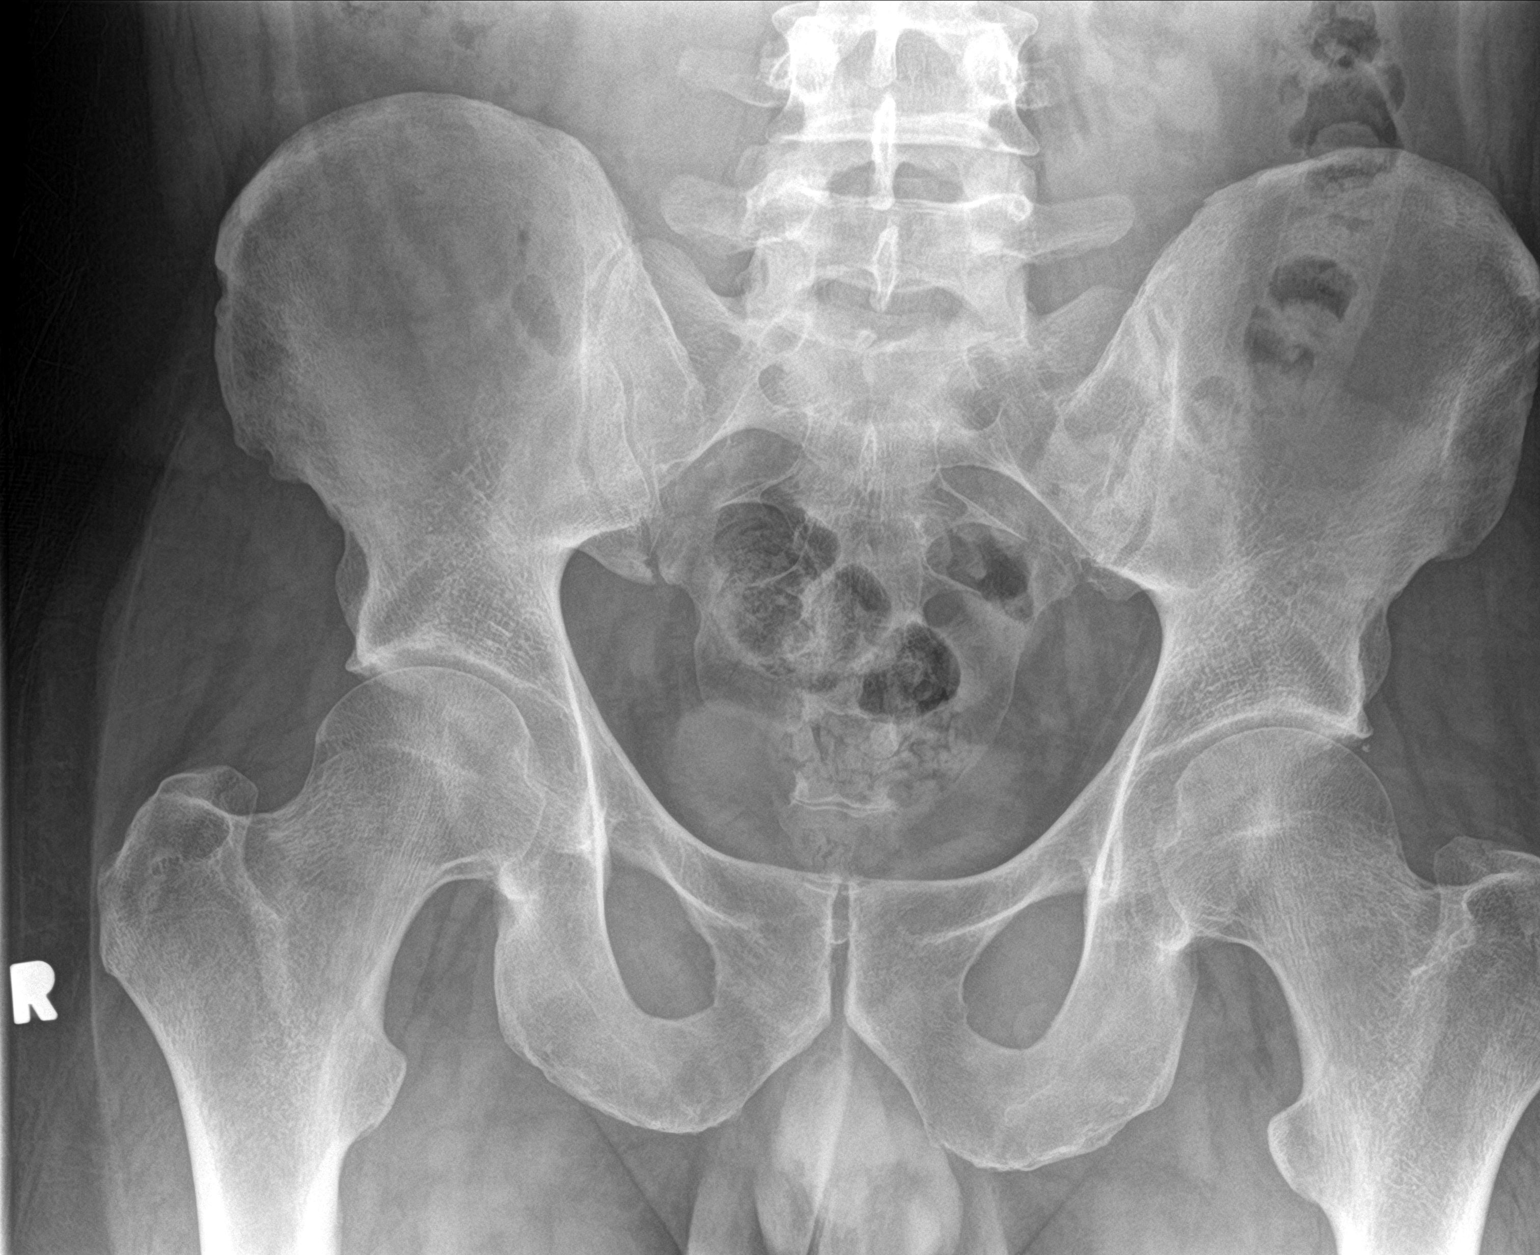

[hip ap]
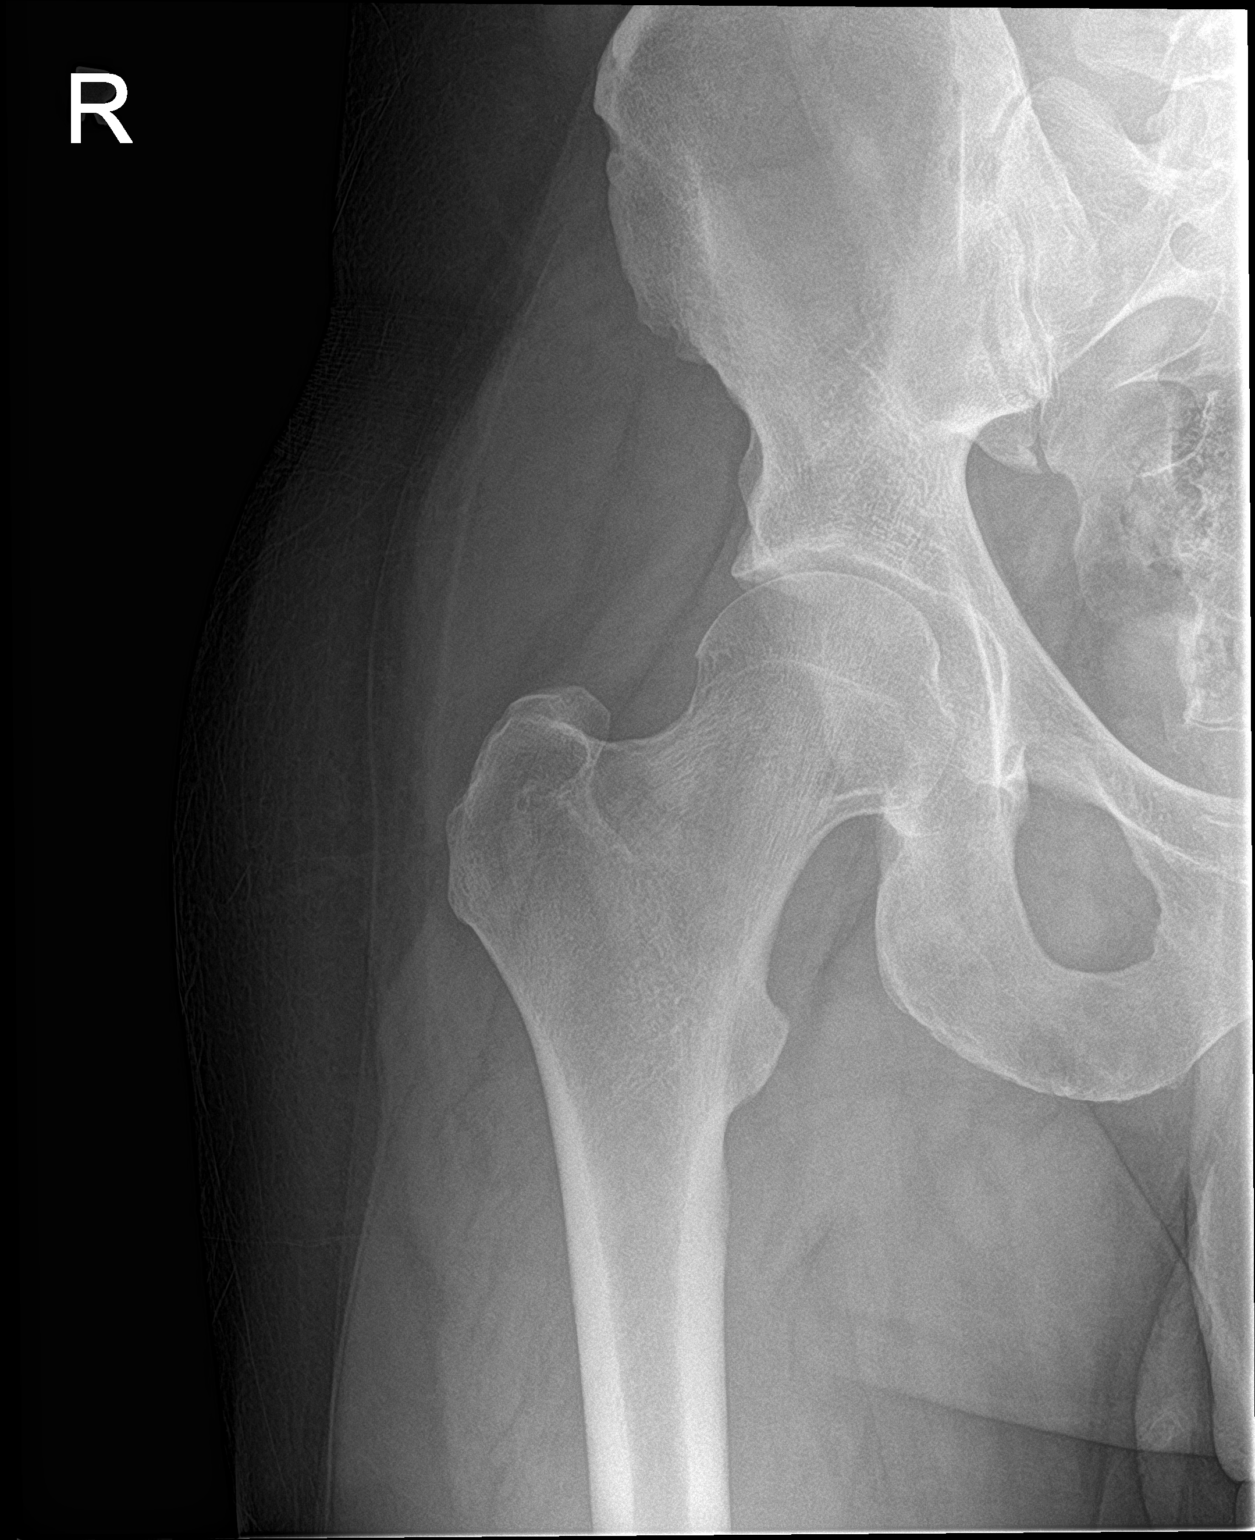

[hip lat]
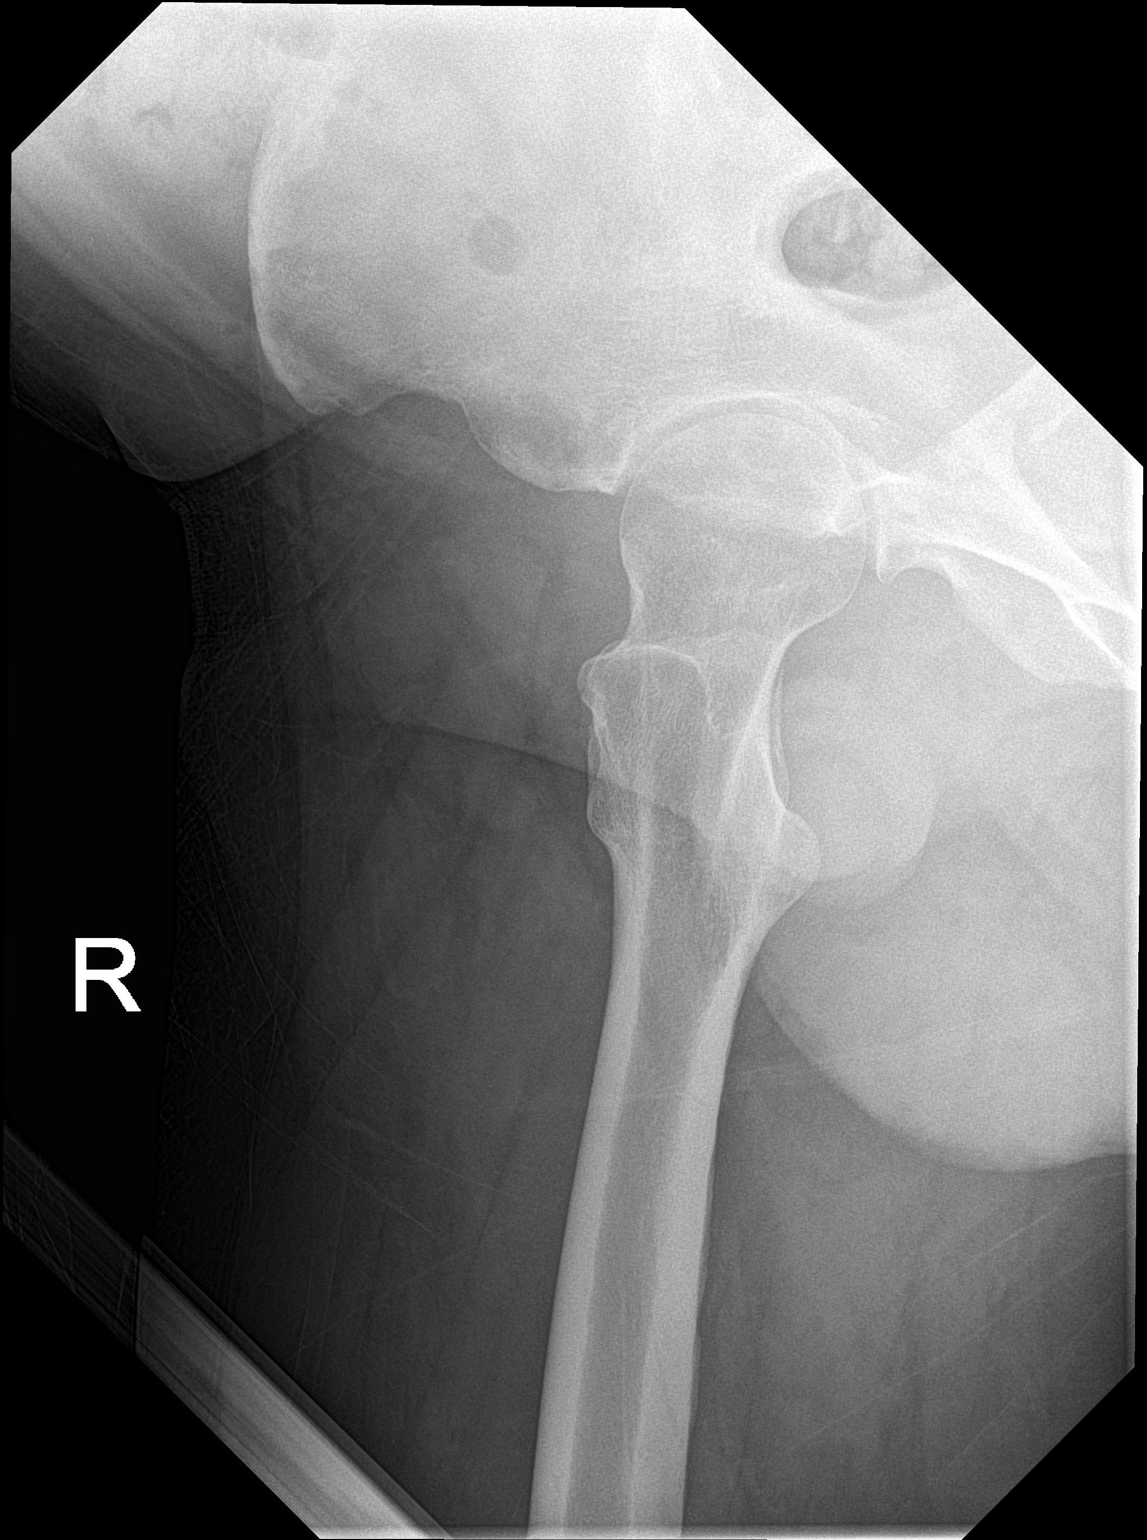

[3 of 3 positions shown; findings below may reference images not displayed]

FINDINGS: The bony pelvis is adequately mineralized. There is no lytic or
blastic lesion nor acute or healing fracture. The sacrum and SI
joints are normal. The hip joint spaces exhibit mild narrowing
bilaterally. The articular surfaces of the femoral heads remain
smoothly rounded. AP and frog-leg lateral views of the right hip
reveal no bony abnormality. The femoral neck, intertrochanteric, and
subtrochanteric regions are normal. The overlying soft tissues are
grossly normal as well.
IMPRESSION: There is mild symmetric narrowing of the hip joints bilaterally.
There is no acute bony abnormality of the right hip nor of the bony
pelvis.

## 2017-05-12 ENCOUNTER — Other Ambulatory Visit: Payer: Self-pay | Admitting: Family Medicine

## 2017-06-16 ENCOUNTER — Encounter: Payer: Self-pay | Admitting: Family Medicine

## 2017-06-16 ENCOUNTER — Ambulatory Visit (INDEPENDENT_AMBULATORY_CARE_PROVIDER_SITE_OTHER): Payer: BLUE CROSS/BLUE SHIELD | Admitting: Family Medicine

## 2017-06-16 VITALS — BP 141/94 | HR 85 | Ht 74.0 in | Wt 278.0 lb

## 2017-06-16 DIAGNOSIS — I10 Essential (primary) hypertension: Secondary | ICD-10-CM

## 2017-06-16 DIAGNOSIS — F902 Attention-deficit hyperactivity disorder, combined type: Secondary | ICD-10-CM

## 2017-06-16 MED ORDER — AMPHETAMINE-DEXTROAMPHETAMINE 20 MG PO TABS: 20.0000 mg | ORAL_TABLET | Freq: Every day | ORAL | 0 refills | Status: DC

## 2017-06-16 MED ORDER — VYVANSE 40 MG PO CAPS: 40.0000 mg | ORAL_CAPSULE | Freq: Every day | ORAL | 0 refills | Status: DC

## 2017-06-16 NOTE — Progress Notes (Signed)
Dillon Robinson is a 31 y.o. male who [facility] today for ADHD and HTN.   [name] has been diagnosed with ADHD by Psychiatry.  He was tried on several different medications and eventually the psychiatrist and Jeannett SeniorStephen found a good combination of medications that work.  Fundamentally one of his issues is that his work is quite long and Vyvanse did not seem to last long enough.  He eventually settled on 40 mg of Vyvanse in the morning 20 mg of immediate release Adderall in the afternoon.  This combination seems to work quite well and allows [name] to function and perform well at work.  Additionally [name] has hypertension.  He takes hydrochlorothiazide and amlodipine.  We avoided ACE inhibitors given a history of angioedema unclear cause.  He tolerates both hydrochlorothiazide and amlodipine well and denies chest pain palpitations or shortness of breath.  He denies any lightheadedness or dizziness.  He is not currently checking his blood pressure regularly.    Past Medical History:  Diagnosis Date  . Angioedema 10/21/2016  . HTN (hypertension) 11/24/2016  . OSA (obstructive sleep apnea) 11/24/2016  . Vitamin D deficiency 10/21/2016   No past surgical history on file. Social History   Tobacco Use  . Smoking status: Never Smoker  . Smokeless tobacco: Never Used  Substance Use Topics  . Alcohol use: Yes   family history includes Gout in his father; Heart failure in his father.  ROS as above:  Medications: Current Outpatient Medications  Medication Sig Dispense Refill  . AMBULATORY NON FORMULARY MEDICATION Continuous positive airway pressure (CPAP) machine auto-titrate from 4-20 cm of H2O pressure, with all supplemental supplies as needed. 1 each 0  . amLODipine (NORVASC) 10 MG tablet Take 1 tablet (10 mg total) daily by mouth. Due for follow up visit 30 tablet 0    . EPINEPHrine 0.3 mg/0.3 mL IJ SOAJ injection Inject 0.3 mLs (0.3 mg total) into the muscle once as needed. Severe lip, tongue or throat swelling 2 Device 1  . hydrochlorothiazide (HYDRODIURIL) 25 MG tablet Take 1 tablet (25 mg total) by mouth daily. 90 tablet 1  . amphetamine-dextroamphetamine (ADDERALL) 20 MG tablet Take 1 tablet (20 mg total) by mouth daily after lunch. 30 tablet 0  . VYVANSE 40 MG capsule Take 1 capsule (40 mg total) by mouth daily. 30 capsule 0   No current facility-administered medications for this visit.    No Known Allergies  Health Maintenance Health Maintenance  Topic Date Due  . HIV Screening  11/06/1998  . TETANUS/TDAP  11/06/2002  . INFLUENZA VACCINE  02/03/2017     Exam:  BP (!) 141/94   Pulse 85   Ht 6\' 2"  (1.88 m)   Wt 278 lb (126.1 kg)   BMI 35.69 kg/m  Gen: Well NAD HEENT: EOMI,  MMM Lungs: Normal work of breathing. CTABL Heart: RRR no MRG Abd: NABS, Soft. Nondistended, Nontender Exts: Brisk capillary refill, warm and well perfused.  Psych: Alert and oriented normal speech thought process and affect.   No results found for this or any previous visit (from the past 72 hour(s)). No results found.    Assessment and Plan: 31 y.o. male with  ADHD: Doing well.  Plan to continue the Vyvanse and Adderall combination.  We will continue to refill medications as needed and will follow up in 3 months.  Hypertension: Blood pressure is a bit higher than I would like.  Plan for home blood pressure log.  If blood pressures remain elevated will start a third medication.  I am reluctant to use ACE inhibitors given history of angioedema. Angiotensin receptor blocker should theoretically be safer.  Recheck in 3 months.   No orders of the defined types were placed in this encounter.  Meds ordered this encounter  Medications  . VYVANSE 40 MG capsule    Sig: Take 1 capsule (40 mg total) by mouth daily.    Dispense:  30 capsule    Refill:  0  .  amphetamine-dextroamphetamine (ADDERALL) 20 MG tablet    Sig: Take 1 tablet (20 mg total) by mouth daily after lunch.    Dispense:  30 tablet    Refill:  0     Discussed warning signs or symptoms. Please see discharge instructions. Patient expresses understanding.  I spent 25 minutes with this patient, greater than 50% was face-to-face time counseling regarding medication combo and treatment plan.  Patient [place] [place] Cont[place] Substance Reporting [place].

## 2017-06-16 NOTE — Patient Instructions (Addendum)
Thank you for coming in today. Resume normal ADHD medicines.  Recheck in 3 months.  Get me a few home blood pressure readings.  Return sooner if needed.

## 2017-07-21 ENCOUNTER — Encounter: Payer: Self-pay | Admitting: Family Medicine

## 2017-07-23 MED ORDER — AMPHETAMINE-DEXTROAMPHETAMINE 20 MG PO TABS: 20.0000 mg | ORAL_TABLET | Freq: Every day | ORAL | 0 refills | Status: DC

## 2017-07-23 MED ORDER — VYVANSE 40 MG PO CAPS: 40.0000 mg | ORAL_CAPSULE | Freq: Every day | ORAL | 0 refills | Status: DC

## 2017-08-28 ENCOUNTER — Other Ambulatory Visit: Payer: Self-pay | Admitting: Family Medicine

## 2017-09-01 ENCOUNTER — Encounter: Payer: Self-pay | Admitting: Family Medicine

## 2017-09-01 MED ORDER — VYVANSE 40 MG PO CAPS: 40.0000 mg | ORAL_CAPSULE | Freq: Every day | ORAL | 0 refills | Status: DC

## 2017-09-01 MED ORDER — AMPHETAMINE-DEXTROAMPHETAMINE 20 MG PO TABS: 20.0000 mg | ORAL_TABLET | Freq: Every day | ORAL | 0 refills | Status: DC

## 2017-09-01 NOTE — Telephone Encounter (Signed)
Done

## 2017-09-13 ENCOUNTER — Ambulatory Visit: Payer: BLUE CROSS/BLUE SHIELD | Admitting: Family Medicine

## 2017-09-13 ENCOUNTER — Encounter: Payer: Self-pay | Admitting: Family Medicine

## 2017-09-13 VITALS — BP 137/85 | HR 90 | Ht 74.0 in | Wt 273.0 lb

## 2017-09-13 DIAGNOSIS — G4733 Obstructive sleep apnea (adult) (pediatric): Secondary | ICD-10-CM

## 2017-09-13 DIAGNOSIS — I10 Essential (primary) hypertension: Secondary | ICD-10-CM

## 2017-09-13 DIAGNOSIS — F902 Attention-deficit hyperactivity disorder, combined type: Secondary | ICD-10-CM | POA: Diagnosis not present

## 2017-09-13 MED ORDER — AMLODIPINE BESYLATE 10 MG PO TABS: 10.0000 mg | ORAL_TABLET | Freq: Every day | ORAL | 0 refills | Status: DC

## 2017-09-13 NOTE — Progress Notes (Signed)
Dillon Robinson is a 32 y.o. male who [facility] today for follow up for ADHD medications, HTN management, and morbid obesity. The patient states his current regimen of ADHD medications is working well. He denies palpitations, light headedness, and headaches with these medications. He states he is occasionally non-compliant with his HTN medications. He reports no adverse symptoms associated with any of his medications. He has not made any life style modifications for his weight, but has lost some weight recently. He has a very active job.    Past Medical History:  Diagnosis Date  . Angioedema 10/21/2016  . HTN (hypertension) 11/24/2016  . OSA (obstructive sleep apnea) 11/24/2016  . Vitamin D deficiency 10/21/2016   No past surgical history on file. Social History   Tobacco Use  . Smoking status: Never Smoker  . Smokeless tobacco: Never Used  Substance Use Topics  . Alcohol use: Yes   family history includes Gout in his father; Heart failure in his father.  ROS as above:  Medications: Current Outpatient Medications  Medication Sig Dispense Refill  . AMBULATORY NON FORMULARY MEDICATION Continuous positive airway pressure (CPAP) machine auto-titrate from 4-20 cm of H2O pressure, with all supplemental supplies as needed. 1 each 0  . amLODipine (NORVASC) 10 MG tablet Take 1 tablet (10 mg total) by mouth daily. 90 tablet 0  . amphetamine-dextroamphetamine (ADDERALL) 20 MG tablet Take 1 tablet (20 mg total) by mouth daily after lunch. 30 tablet 0  . EPINEPHrine 0.3 mg/0.3 mL IJ SOAJ injection Inject 0.3 mLs (0.3 mg total) into the muscle once as needed. Severe lip, tongue or throat swelling 2 Device 1  . hydrochlorothiazide (HYDRODIURIL) 25 MG tablet Take 1 tablet (25 mg total) by mouth daily. 90 tablet 1  . VYVANSE 40 MG capsule Take 1 capsule (40 mg total) by  mouth daily. 30 capsule 0   No current facility-administered medications for this visit.    No Known Allergies  Health Maintenance Health Maintenance  Topic Date Due  . HIV Screening  11/06/1998  . TETANUS/TDAP  11/06/2002  . INFLUENZA VACCINE  02/17/2018 (Originally 02/03/2017)     Exam:  BP 137/85   Pulse 90   Ht 6\' 2"  (1.88 m)   Wt 273 lb (123.8 kg)   BMI 35.05 kg/m  Gen: Well NAD HEENT: EOMI,  MMM Lungs: Normal work of breathing. CTABL Heart: RRR no MRG Abd: NABS, Soft. Nondistended, Nontender Exts: Brisk capillary refill, warm and well perfused.  Psych: Alert and oriented. Normal speech and affect.     No results found for this or any previous visit (from the past 72 hour(s)). No results found.    Assessment and Plan: 32 y.o. male with ADHD, HTN, and morbid obesity. He is tolerating his therapy well and will continue with all medications as previously prescribed.   ADHD: Continue with current medication regimen. Keep drugs in safe place as they are controlled substances. Report back any changes in efficacy or new symptoms.   HTN: Attempt to be more compliant with current medications. Perhaps change routine to be more inclusive of taking medications. Consider drug caddy to help remain compliant. Report back any new symptoms.   Morbid Obesity: Consider tracking calories. Continue to stay active.   OSA: Recommend CPAP. Will follow as it was too expensive previously.    No orders of the defined types were placed in this encounter.  Meds ordered this encounter  Medications  . amLODipine (NORVASC) 10 MG tablet    Sig: Take 1 tablet (10 mg total) by mouth daily.    Dispense:  90 tablet    Refill:  0     Discussed warning signs or symptoms. Please see discharge instructions. Patient expresses understanding.

## 2017-09-13 NOTE — Patient Instructions (Signed)
Thank you for coming in today. Consider CPAP if able.  Continue current medicine. Weight loss will help with sleep apnea and blood pressure.  Recheck with me in 3 months.  We will try to do fasting labs ahead of time.  Send me a message about 2 weeks prior to your follow up.  Schedule well adult visit in 3 months.

## 2017-09-29 ENCOUNTER — Encounter: Payer: Self-pay | Admitting: Family Medicine

## 2017-09-29 MED ORDER — VYVANSE 40 MG PO CAPS: 40.0000 mg | ORAL_CAPSULE | Freq: Every day | ORAL | 0 refills | Status: DC

## 2017-09-29 MED ORDER — AMPHETAMINE-DEXTROAMPHETAMINE 20 MG PO TABS: 20.0000 mg | ORAL_TABLET | Freq: Every day | ORAL | 0 refills | Status: DC

## 2017-11-05 ENCOUNTER — Encounter: Payer: Self-pay | Admitting: Family Medicine

## 2017-11-05 MED ORDER — AMPHETAMINE-DEXTROAMPHETAMINE 20 MG PO TABS: 20.0000 mg | ORAL_TABLET | Freq: Every day | ORAL | 0 refills | Status: DC

## 2017-11-05 MED ORDER — VYVANSE 40 MG PO CAPS: 40.0000 mg | ORAL_CAPSULE | Freq: Every day | ORAL | 0 refills | Status: DC

## 2017-12-14 ENCOUNTER — Encounter: Payer: BLUE CROSS/BLUE SHIELD | Admitting: Family Medicine

## 2017-12-28 ENCOUNTER — Encounter: Payer: BLUE CROSS/BLUE SHIELD | Admitting: Family Medicine

## 2018-08-09 ENCOUNTER — Ambulatory Visit: Payer: BLUE CROSS/BLUE SHIELD | Admitting: Family Medicine

## 2018-08-09 ENCOUNTER — Encounter: Payer: Self-pay | Admitting: Family Medicine

## 2018-08-09 VITALS — BP 156/94 | HR 86 | Ht 74.0 in | Wt 294.0 lb

## 2018-08-09 DIAGNOSIS — F902 Attention-deficit hyperactivity disorder, combined type: Secondary | ICD-10-CM

## 2018-08-09 DIAGNOSIS — G4733 Obstructive sleep apnea (adult) (pediatric): Secondary | ICD-10-CM | POA: Diagnosis not present

## 2018-08-09 DIAGNOSIS — Z0000 Encounter for general adult medical examination without abnormal findings: Secondary | ICD-10-CM

## 2018-08-09 DIAGNOSIS — I10 Essential (primary) hypertension: Secondary | ICD-10-CM | POA: Diagnosis not present

## 2018-08-09 MED ORDER — AMLODIPINE BESYLATE 10 MG PO TABS: 10.0000 mg | ORAL_TABLET | Freq: Every day | ORAL | 3 refills | Status: DC

## 2018-08-09 MED ORDER — AMBULATORY NON FORMULARY MEDICATION: 0 refills | Status: DC

## 2018-08-09 MED ORDER — HYDROCHLOROTHIAZIDE 25 MG PO TABS: 25.0000 mg | ORAL_TABLET | Freq: Every day | ORAL | 3 refills | Status: DC

## 2018-08-09 MED ORDER — LISDEXAMFETAMINE DIMESYLATE 40 MG PO CAPS: 40.0000 mg | ORAL_CAPSULE | Freq: Every day | ORAL | 0 refills | Status: DC

## 2018-08-09 MED ORDER — AMPHETAMINE-DEXTROAMPHETAMINE 20 MG PO TABS: 20.0000 mg | ORAL_TABLET | Freq: Every day | ORAL | 0 refills | Status: DC

## 2018-08-09 NOTE — Patient Instructions (Addendum)
Thank you for coming in today. Restart vyvanse and adderall.  Restart blood pressure medicines.  You should hear about CPAP company.  Let me know if it is expensive.  Typically we recheck about 6 weeks after starting CPAP.    Recheck in 3 months.    Work on reduced calories  Goal of 2000 calories a day will result in weight loss.    Get fasting labs in the near future.      Sleep Apnea Sleep apnea is a condition in which breathing pauses or becomes shallow during sleep. Episodes of sleep apnea usually last 10 seconds or longer, and they may occur as many as 20 times an hour. Sleep apnea disrupts your sleep and keeps your body from getting the rest that it needs. This condition can increase your risk of certain health problems, including:  Heart attack.  Stroke.  Obesity.  Diabetes.  Heart failure.  Irregular heartbeat. There are three kinds of sleep apnea:  Obstructive sleep apnea. This kind is caused by a blocked or collapsed airway.  Central sleep apnea. This kind happens when the part of the brain that controls breathing does not send the correct signals to the muscles that control breathing.  Mixed sleep apnea. This is a combination of obstructive and central sleep apnea. What are the causes? The most common cause of this condition is a collapsed or blocked airway. An airway can collapse or become blocked if:  Your throat muscles are abnormally relaxed.  Your tongue and tonsils are larger than normal.  You are overweight.  Your airway is smaller than normal. What increases the risk? This condition is more likely to develop in people who:  Are overweight.  Smoke.  Have a smaller than normal airway.  Are elderly.  Are male.  Drink alcohol.  Take sedatives or tranquilizers.  Have a family history of sleep apnea. What are the signs or symptoms? Symptoms of this condition include:  Trouble staying asleep.  Daytime sleepiness and  tiredness.  Irritability.  Loud snoring.  Morning headaches.  Trouble concentrating.  Forgetfulness.  Decreased interest in sex.  Unexplained sleepiness.  Mood swings.  Personality changes.  Feelings of depression.  Waking up often during the night to urinate.  Dry mouth.  Sore throat. How is this diagnosed? This condition may be diagnosed with:  A medical history.  A physical exam.  A series of tests that are done while you are sleeping (sleep study). These tests are usually done in a sleep lab, but they may also be done at home. How is this treated? Treatment for this condition aims to restore normal breathing and to ease symptoms during sleep. It may involve managing health issues that can affect breathing, such as high blood pressure or obesity. Treatment may include:  Sleeping on your side.  Using a decongestant if you have nasal congestion.  Avoiding the use of depressants, including alcohol, sedatives, and narcotics.  Losing weight if you are overweight.  Making changes to your diet.  Quitting smoking.  Using a device to open your airway while you sleep, such as: ? An oral appliance. This is a custom-made mouthpiece that shifts your lower jaw forward. ? A continuous positive airway pressure (CPAP) device. This device delivers oxygen to your airway through a mask. ? A nasal expiratory positive airway pressure (EPAP) device. This device has valves that you put into each nostril. ? A bi-level positive airway pressure (BPAP) device. This device delivers oxygen to your airway through  a mask.  Surgery if other treatments do not work. During surgery, excess tissue is removed to create a wider airway. It is important to get treatment for sleep apnea. Without treatment, this condition can lead to:  High blood pressure.  Coronary artery disease.  (Men) An inability to achieve or maintain an erection (impotence).  Reduced thinking abilities. Follow these  instructions at home:  Make any lifestyle changes that your health care provider recommends.  Eat a healthy, well-balanced diet.  Take over-the-counter and prescription medicines only as told by your health care provider.  Avoid using depressants, including alcohol, sedatives, and narcotics.  Take steps to lose weight if you are overweight.  If you were given a device to open your airway while you sleep, use it only as told by your health care provider.  Do not use any tobacco products, such as cigarettes, chewing tobacco, and e-cigarettes. If you need help quitting, ask your health care provider.  Keep all follow-up visits as told by your health care provider. This is important. Contact a health care provider if:  The device that you received to open your airway during sleep is uncomfortable or does not seem to be working.  Your symptoms do not improve.  Your symptoms get worse. Get help right away if:  You develop chest pain.  You develop shortness of breath.  You develop discomfort in your back, arms, or stomach.  You have trouble speaking.  You have weakness on one side of your body.  You have drooping in your face. These symptoms may represent a serious problem that is an emergency. Do not wait to see if the symptoms will go away. Get medical help right away. Call your local emergency services (911 in the [place] Do not drive yourself to the hospital. This information is not intended to replace advice given to you by your health care provider. Make sure you discuss any questions you have with your health care provider. Document Released: 06/12/2002 Document Revised: 01/18/2017 Document Reviewed: 04/01/2015 Elsevier Interactive Patient Education  2019 [employer]

## 2018-08-09 NOTE — Progress Notes (Signed)
Dillon Robinson is a 33 y.o. male who presents [facility] today for well adult visit.   Dillon Robinson has been somewhat lost to follow-up.  He is run out of all of his medications.  He previously had blood pressure controlled reasonably well with amlodipine and hydrochlorothiazide.  He had a history of angioedema on ACE inhibitor and has not been taking an ACE inhibitor for years.  He is not been taking his blood pressure medications for some time now.  He does not check his blood pressure at home.  No chest pain palpitations or shortness of breath.  Additionally he has ADHD that typically was controlled with Vyvanse with an additional Adderall in the afternoon for long days.  This worked quite well to help him focus at work.  He is run out of this medication and notes worsening inattentive symptoms.  Additionally he was diagnosed with sleep apnea in 2018.  He was unable to afford CPAP machine.  He notes snoring and daytime fatigue.  He does not feel rested and wakes up in the morning.  Lastly he is regained weight.  He is not eating a careful diet or exercising regularly.   ROS as above:  Past Medical History:  Diagnosis Date  . Angioedema 10/21/2016  . HTN (hypertension) 11/24/2016  . OSA (obstructive sleep apnea) 11/24/2016  . Vitamin D deficiency 10/21/2016   No past surgical history on file. Social History   Tobacco Use  . Smoking status: Never Smoker  . Smokeless tobacco: Never Used  Substance Use Topics  . Alcohol use: Yes   family history includes Gout in his father; Heart failure in his father.  Medications: Current Outpatient Medications  Medication Sig Dispense Refill  . AMBULATORY NON FORMULARY MEDICATION Continuous positive airway pressure (CPAP) machine auto-titrate from 4-20 cm of H2O pressure, with all supplemental supplies as needed. 1 each 0  .  amLODipine (NORVASC) 10 MG tablet Take 1 tablet (10 mg total) by mouth daily. 90 tablet 3  . amphetamine-dextroamphetamine (ADDERALL) 20 MG tablet Take 1 tablet (20 mg total) by mouth daily after lunch. 30 tablet 0  . EPINEPHrine 0.3 mg/0.3 mL IJ SOAJ injection Inject 0.3 mLs (0.3 mg total) into the muscle once as needed. Severe lip, tongue or throat swelling 2 Device 1  . hydrochlorothiazide (HYDRODIURIL) 25 MG tablet Take 1 tablet (25 mg total) by mouth daily. 90 tablet 3  . lisdexamfetamine (VYVANSE) 40 MG capsule Take 1 capsule (40 mg total) by mouth daily. 30 capsule 0   No current facility-administered medications for this visit.    No Known Allergies  Health Maintenance Health Maintenance  Topic Date Due  . HIV Screening  09/14/2018 (Originally 11/06/1998)  . INFLUENZA VACCINE  07/29/2019 (Originally 02/03/2018)  . TETANUS/TDAP  07/29/2019 (Originally 11/06/2002)     Exam:  BP (!) 156/94   Pulse 86   Ht 6\' 2"  (1.88 m)   Wt 294 lb (133.4 kg)   BMI 37.75 kg/m  Wt Readings from Last 5 Encounters:  08/09/18 294 lb (133.4 kg)  09/13/17 273 lb (123.8 kg)  06/16/17 278 lb (126.1 kg)  01/28/17 286 lb (129.7 kg)  01/12/17 285 lb (129.3 kg)      Gen: Well NAD HEENT: EOMI,  MMM Lungs: Normal work of breathing. CTABL Heart: RRR no MRG Abd: NABS, Soft. Nondistended, Nontender Exts: Brisk capillary refill, warm and well perfused.  Psych: Normal speech thought process and  affect.  No SI or HI expressed.  Dep[facility] screen [facility] 08/09/2018 01/28/2017  Decreased Interest 1 1  Down, Depressed, Hopeless 1 0  PHQ - 2 Score 2 1  Altered sleeping 0 -  Tired, decreased energy 3 -  Change in appetite 2 -  Feeling bad or failure about yourself  0 -  Trouble concentrating 2 -  Moving slowly or fidgety/restless 0 -  Suicidal thoughts 0 -  PHQ-9 Score 9 -  Difficult doing work/chores Somewhat difficult -       Assessment and Plan: 33 y.o. male with  Well adult.  Several issues to  work on listed below.  Improve lifestyle and recheck in 3 months.  Hypertension: Not controlled today.  Restart hydrochlorothiazide and amlodipine.  Check basic fasting labs listed below and recheck in 3 months.  Sleep apnea: Difficult medical problem.  Will retry for CPAP machine.  Recheck in 3 months or sooner if needed for compliance report checking.  Obesity: Central medical problem.  Discussed lifestyle modification including diet and exercise changes.  ADHD: Refill medications.  Recheck in 3 months if all is well.  PDMP reviewed during this encounter. Orders Placed This Encounter  Procedures  . CBC  . COMPLETE METABOLIC PANEL WITH GFR  . Lipid Panel w/reflex Direct LDL   Meds ordered this encounter  Medications  . amLODipine (NORVASC) 10 MG tablet    Sig: Take 1 tablet (10 mg total) by mouth daily.    Dispense:  90 tablet    Refill:  3  . hydrochlorothiazide (HYDRODIURIL) 25 MG tablet    Sig: Take 1 tablet (25 mg total) by mouth daily.    Dispense:  90 tablet    Refill:  3  . lisdexamfetamine (VYVANSE) 40 MG capsule    Sig: Take 1 capsule (40 mg total) by mouth daily.    Dispense:  30 capsule    Refill:  0  . amphetamine-dextroamphetamine (ADDERALL) 20 MG tablet    Sig: Take 1 tablet (20 mg total) by mouth daily after lunch.    Dispense:  30 tablet    Refill:  0  . AMBULATORY NON FORMULARY MEDICATION    Sig: Continuous positive airway pressure (CPAP) machine auto-titrate from 4-20 cm of H2O pressure, with all supplemental supplies as needed.    Dispense:  1 each    Refill:  0     Discussed warning signs or symptoms. Please see discharge instructions. Patient expresses understanding.

## 2018-09-14 ENCOUNTER — Other Ambulatory Visit: Payer: Self-pay | Admitting: Family Medicine

## 2018-09-14 NOTE — Telephone Encounter (Signed)
Patient called and is in need of a refill of his Adderall. I have pended the medication and pharmacy is correct. Please advise.

## 2018-09-15 MED ORDER — AMPHETAMINE-DEXTROAMPHETAMINE 20 MG PO TABS: 20.0000 mg | ORAL_TABLET | Freq: Every day | ORAL | 0 refills | Status: DC

## 2018-09-15 MED ORDER — LISDEXAMFETAMINE DIMESYLATE 40 MG PO CAPS: 40.0000 mg | ORAL_CAPSULE | Freq: Every day | ORAL | 0 refills | Status: DC

## 2018-09-20 ENCOUNTER — Telehealth: Payer: Self-pay | Admitting: Family Medicine

## 2018-09-20 MED ORDER — AMPHETAMINE-DEXTROAMPHETAMINE 20 MG PO TABS: 20.0000 mg | ORAL_TABLET | Freq: Every day | ORAL | 0 refills | Status: DC

## 2018-09-20 NOTE — Telephone Encounter (Signed)
Recent Adderall.

## 2018-09-20 NOTE — Telephone Encounter (Signed)
-----   Message from Lianne Cure, LPN sent at [phone]  4:47 PM EDT -----

## 2018-09-21 MED ORDER — AMPHETAMINE-DEXTROAMPHETAMINE 20 MG PO TABS: 20.0000 mg | ORAL_TABLET | Freq: Every day | ORAL | 0 refills | Status: DC

## 2018-09-21 NOTE — Telephone Encounter (Signed)
Adderall again sent to pharmacy to make sure it is actually there.

## 2018-10-23 ENCOUNTER — Other Ambulatory Visit: Payer: Self-pay | Admitting: Family Medicine

## 2018-10-25 MED ORDER — LISDEXAMFETAMINE DIMESYLATE 40 MG PO CAPS: 40.0000 mg | ORAL_CAPSULE | Freq: Every day | ORAL | 0 refills | Status: DC

## 2018-10-25 MED ORDER — AMPHETAMINE-DEXTROAMPHETAMINE 20 MG PO TABS: 20.0000 mg | ORAL_TABLET | Freq: Every day | ORAL | 0 refills | Status: DC

## 2018-10-25 NOTE — Telephone Encounter (Signed)
Pt advised. He will schedule virtual visit.

## 2018-10-25 NOTE — Telephone Encounter (Signed)
Vyvanse and Adderall refilled.  Patient will need video visit follow-up ADHD with me in about 1 month.  Please schedule appointment.

## 2018-11-09 ENCOUNTER — Ambulatory Visit (INDEPENDENT_AMBULATORY_CARE_PROVIDER_SITE_OTHER): Payer: BLUE CROSS/BLUE SHIELD | Admitting: Family Medicine

## 2018-11-09 ENCOUNTER — Encounter: Payer: Self-pay | Admitting: Family Medicine

## 2018-11-09 VITALS — Ht 74.0 in | Wt 285.0 lb

## 2018-11-09 DIAGNOSIS — I10 Essential (primary) hypertension: Secondary | ICD-10-CM

## 2018-11-09 DIAGNOSIS — F902 Attention-deficit hyperactivity disorder, combined type: Secondary | ICD-10-CM

## 2018-11-09 DIAGNOSIS — G4733 Obstructive sleep apnea (adult) (pediatric): Secondary | ICD-10-CM | POA: Diagnosis not present

## 2018-11-09 MED ORDER — LISDEXAMFETAMINE DIMESYLATE 40 MG PO CAPS: 40.0000 mg | ORAL_CAPSULE | ORAL | 0 refills | Status: DC

## 2018-11-09 MED ORDER — AMBULATORY NON FORMULARY MEDICATION: 0 refills | Status: AC

## 2018-11-09 MED ORDER — LISDEXAMFETAMINE DIMESYLATE 40 MG PO CAPS: 40.0000 mg | ORAL_CAPSULE | Freq: Every day | ORAL | 0 refills | Status: DC

## 2018-11-09 MED ORDER — AMPHETAMINE-DEXTROAMPHETAMINE 20 MG PO TABS: 20.0000 mg | ORAL_TABLET | Freq: Every day | ORAL | 0 refills | Status: DC

## 2018-11-09 NOTE — Patient Instructions (Signed)
Thank you for coming in today.    

## 2018-11-09 NOTE — Progress Notes (Signed)
Virtual Visit  via Video Note  I connected with      Dillon Robinson by a video enabled telemedicine application and verified that I am speaking with the correct person using two identifiers.   I discussed the limitations of evaluation and management by telemedicine and the availability of in person appointments. The patient expressed understanding and agreed to proceed.  History of Present Illness: Dillon Robinson is a 33 y.o. male who would like to discuss  ADHD, hypertension, sleep apnea.  ADHD is doing well.  Patient takes Vyvanse 40 mg in the morning and 20 mg immediate release Adderall in the afternoon.  This works quite well to control symptoms.  He tolerates this regimen well and is happy with how things are going.  Hypertension: Taking amlodipine and hydrochlorothiazide.  Patient had angioedema with ACE inhibitors in the past.  He feels well in the regimen but is unable to check his blood pressure as he does not have a home blood pressure cuff.  Sleep apnea: Patient has a history of sleep apnea found to be severe with a sleep study in 2018.  We had trouble getting a CPAP machine authorized or approved.  CPAP was ordered in February but patient thinks he was called once by the CPAP company but never called   Observations/Objective: Ht 6\' 2"  (1.88 m)   Wt 285 lb (129.3 kg)   BMI 36.59 kg/m  Wt Readings from Last 5 Encounters:  11/09/18 285 lb (129.3 kg)  08/09/18 294 lb (133.4 kg)  09/13/17 273 lb (123.8 kg)  06/16/17 278 lb (126.1 kg)  01/28/17 286 lb (129.7 kg)   Exam: Appearance nontoxic no acute distress Normal Speech.    Assessment and Plan: 33 y.o. male with ADHD: Doing well continue current regimen.  Recheck 3 months  Hypertension: Clinically doing well however patient does not have a home blood pressure cuff so is hard to know where his blood pressure readings are.  Recommend obtaining home blood pressure cuff and reporting back.  Sleep apnea: We will re-prescribe  CPAP in hopes that he will now be able to get it.  Expressed the importance of control for sleep apnea to reduce heart risk.  PDMP not reviewed this encounter. No orders of the defined types were placed in this encounter.  Meds ordered this encounter  Medications  . amphetamine-dextroamphetamine (ADDERALL) 20 MG tablet    Sig: Take 1 tablet (20 mg total) by mouth daily after lunch. Fill now    Dispense:  30 tablet    Refill:  0  . lisdexamfetamine (VYVANSE) 40 MG capsule    Sig: Take 1 capsule (40 mg total) by mouth daily. Fill now    Dispense:  30 capsule    Refill:  0  . lisdexamfetamine (VYVANSE) 40 MG capsule    Sig: Take 1 capsule (40 mg total) by mouth every morning. Fill in 30 days    Dispense:  30 capsule    Refill:  0  . amphetamine-dextroamphetamine (ADDERALL) 20 MG tablet    Sig: Take 1 tablet (20 mg total) by mouth daily. Fill in 30 days    Dispense:  30 tablet    Refill:  0  . lisdexamfetamine (VYVANSE) 40 MG capsule    Sig: Take 1 capsule (40 mg total) by mouth every morning. Fill in 60 days    Dispense:  30 capsule    Refill:  0  . amphetamine-dextroamphetamine (ADDERALL) 20 MG tablet    Sig: Take  1 tablet (20 mg total) by mouth daily. Fill in 60 days    Dispense:  30 tablet    Refill:  0  . AMBULATORY NON FORMULARY MEDICATION    Sig: Continuous positive airway pressure (CPAP) machine auto-titrate from 4-20 cm of H2O pressure, with all supplemental supplies as needed.    Dispense:  1 each    Refill:  0    Follow Up Instructions:    I discussed the assessment and treatment plan with the patient. The patient was provided an opportunity to ask questions and all were answered. The patient agreed with the plan and demonstrated an understanding of the instructions.   The patient was advised to call back or seek an in-person evaluation if the symptoms worsen or if the condition fails to improve as anticipated.  Time: 15 minutes of intraservice time, with >22  minutes of total time during today's visit.      Historical information moved to improve visibility of documentation.  Past Medical History:  Diagnosis Date  . Angioedema 10/21/2016  . HTN (hypertension) 11/24/2016  . OSA (obstructive sleep apnea) 11/24/2016  . Vitamin D deficiency 10/21/2016   No past surgical history on file. Social History   Tobacco Use  . Smoking status: Never Smoker  . Smokeless tobacco: Never Used  Substance Use Topics  . Alcohol use: Yes   family history includes Gout in his father; Heart failure in his father.  Medications: Current Outpatient Medications  Medication Sig Dispense Refill  . amLODipine (NORVASC) 10 MG tablet Take 1 tablet (10 mg total) by mouth daily. 90 tablet 3  . amphetamine-dextroamphetamine (ADDERALL) 20 MG tablet Take 1 tablet (20 mg total) by mouth daily after lunch. Fill now 30 tablet 0  . hydrochlorothiazide (HYDRODIURIL) 25 MG tablet Take 1 tablet (25 mg total) by mouth daily. 90 tablet 3  . lisdexamfetamine (VYVANSE) 40 MG capsule Take 1 capsule (40 mg total) by mouth daily. Fill now 30 capsule 0  . AMBULATORY NON FORMULARY MEDICATION Continuous positive airway pressure (CPAP) machine auto-titrate from 4-20 cm of H2O pressure, with all supplemental supplies as needed. 1 each 0  . amphetamine-dextroamphetamine (ADDERALL) 20 MG tablet Take 1 tablet (20 mg total) by mouth daily. Fill in 30 days 30 tablet 0  . amphetamine-dextroamphetamine (ADDERALL) 20 MG tablet Take 1 tablet (20 mg total) by mouth daily. Fill in 60 days 30 tablet 0  . lisdexamfetamine (VYVANSE) 40 MG capsule Take 1 capsule (40 mg total) by mouth every morning. Fill in 30 days 30 capsule 0  . lisdexamfetamine (VYVANSE) 40 MG capsule Take 1 capsule (40 mg total) by mouth every morning. Fill in 60 days 30 capsule 0   No current facility-administered medications for this visit.    No Known Allergies

## 2019-01-13 ENCOUNTER — Other Ambulatory Visit: Payer: Self-pay | Admitting: Family Medicine

## 2019-01-17 ENCOUNTER — Encounter: Payer: Self-pay | Admitting: Family Medicine

## 2019-01-17 MED ORDER — AMPHETAMINE-DEXTROAMPHETAMINE 20 MG PO TABS: 20.0000 mg | ORAL_TABLET | Freq: Every day | ORAL | 0 refills | Status: DC

## 2019-01-17 MED ORDER — LISDEXAMFETAMINE DIMESYLATE 40 MG PO CAPS: 40.0000 mg | ORAL_CAPSULE | Freq: Every day | ORAL | 0 refills | Status: DC

## 2019-02-09 ENCOUNTER — Encounter: Payer: Self-pay | Admitting: Family Medicine

## 2019-02-09 ENCOUNTER — Telehealth (INDEPENDENT_AMBULATORY_CARE_PROVIDER_SITE_OTHER): Payer: BC Managed Care – PPO | Admitting: Family Medicine

## 2019-02-09 VITALS — Temp 98.4°F | Ht 74.0 in | Wt 290.0 lb

## 2019-02-09 DIAGNOSIS — I10 Essential (primary) hypertension: Secondary | ICD-10-CM | POA: Diagnosis not present

## 2019-02-09 DIAGNOSIS — G4733 Obstructive sleep apnea (adult) (pediatric): Secondary | ICD-10-CM | POA: Diagnosis not present

## 2019-02-09 MED ORDER — LISDEXAMFETAMINE DIMESYLATE 40 MG PO CAPS: 40.0000 mg | ORAL_CAPSULE | Freq: Every day | ORAL | 0 refills | Status: DC

## 2019-02-09 MED ORDER — AMPHETAMINE-DEXTROAMPHETAMINE 20 MG PO TABS: 20.0000 mg | ORAL_TABLET | Freq: Every day | ORAL | 0 refills | Status: DC

## 2019-02-09 MED ORDER — LISDEXAMFETAMINE DIMESYLATE 40 MG PO CAPS: 40.0000 mg | ORAL_CAPSULE | ORAL | 0 refills | Status: DC

## 2019-02-09 NOTE — Progress Notes (Signed)
Virtual Visit  via Video Note  I connected with      Dillon Robinson by a video enabled telemedicine application and verified that I am speaking with the correct person using two identifiers.   I discussed the limitations of evaluation and management by telemedicine and the availability of in person appointments. The patient expressed understanding and agreed to proceed.  History of Present Illness: Dillon Robinson is a 33 y.o. male who would like to discuss ADHD sleep apnea hypertension.   ADHD: Doing well with medications listed below.  Medications along to focus.  He denies significant obnoxious side effects.  OSA: At last visit plan to prescribe CPAP and restart therapy.  He did get a call from the sleep company and is planning on getting the CPAP machine but has not followed through with actually getting it yet.    HTN: Taking medications listed below.  Typically doing pretty well.  Patient plans on getting a home blood pressure meter at the last visit but never got it.   Observations/Objective: Temp 98.4 F (36.9 C) (Oral)   Ht 6\' 2"  (1.88 m)   Wt 290 lb (131.5 kg)   BMI 37.23 kg/m  Wt Readings from Last 5 Encounters:  02/09/19 290 lb (131.5 kg)  11/09/18 285 lb (129.3 kg)  08/09/18 294 lb (133.4 kg)  09/13/17 273 lb (123.8 kg)  06/16/17 278 lb (126.1 kg)   Exam: Appearance nontoxic no acute distress Normal Speech.  Psych alert and oriented normal speech thought process and affect  Lab and Radiology Results No results found for this or any previous visit (from the past 72 hour(s)). No results found.   Assessment and Plan: 33 y.o. male with ADHD.  Doing quite well.  Plan to continue current regimen and recheck in 3 to 6 months.  Hypertension: Control is somewhat unclear given telemedicine visits recently.  Plan for home blood pressure meter and recheck 3 to 6 months.  Get basic fasting labs listed below.  Continue current medications.  Sleep apnea: Recommend following  through with actually getting the CPAP machine and starting to use it.  Likely will need face-to-face visit or virtual face-to-face visit once he starts using CPAP report and we get our first compliance report.  If all is well recheck 3 to 6 months.  PDMP reviewed during this encounter. Orders Placed This Encounter  Procedures  . CBC  . COMPLETE METABOLIC PANEL WITH GFR  . Lipid Panel w/reflex Direct LDL   Meds ordered this encounter  Medications  . amphetamine-dextroamphetamine (ADDERALL) 20 MG tablet    Sig: Take 1 tablet (20 mg total) by mouth daily. Fill in 30 days    Dispense:  30 tablet    Refill:  0  . amphetamine-dextroamphetamine (ADDERALL) 20 MG tablet    Sig: Take 1 tablet (20 mg total) by mouth daily. Fill in 60 days    Dispense:  30 tablet    Refill:  0  . amphetamine-dextroamphetamine (ADDERALL) 20 MG tablet    Sig: Take 1 tablet (20 mg total) by mouth daily after lunch. Fill now    Dispense:  30 tablet    Refill:  0  . lisdexamfetamine (VYVANSE) 40 MG capsule    Sig: Take 1 capsule (40 mg total) by mouth every morning. Fill in 30 days    Dispense:  30 capsule    Refill:  0  . lisdexamfetamine (VYVANSE) 40 MG capsule    Sig: Take 1 capsule (40 mg  total) by mouth every morning. Fill in 60 days    Dispense:  30 capsule    Refill:  0  . lisdexamfetamine (VYVANSE) 40 MG capsule    Sig: Take 1 capsule (40 mg total) by mouth daily. Fill now    Dispense:  30 capsule    Refill:  0    Follow Up Instructions:    I discussed the assessment and treatment plan with the patient. The patient was provided an opportunity to ask questions and all were answered. The patient agreed with the plan and demonstrated an understanding of the instructions.   The patient was advised to call back or seek an in-person evaluation if the symptoms worsen or if the condition fails to improve as anticipated.  Time: 15 minutes of intraservice time, with >22 minutes of total time during  today's visit.      Historical information moved to improve visibility of documentation.  Past Medical History:  Diagnosis Date  . Angioedema 10/21/2016  . HTN (hypertension) 11/24/2016  . OSA (obstructive sleep apnea) 11/24/2016  . Vitamin D deficiency 10/21/2016   No past surgical history on file. Social History   Tobacco Use  . Smoking status: Never Smoker  . Smokeless tobacco: Never Used  Substance Use Topics  . Alcohol use: Yes   family history includes Gout in his father; Heart failure in his father.  Medications: Current Outpatient Medications  Medication Sig Dispense Refill  . AMBULATORY NON FORMULARY MEDICATION Continuous positive airway pressure (CPAP) machine auto-titrate from 4-20 cm of H2O pressure, with all supplemental supplies as needed. 1 each 0  . amLODipine (NORVASC) 10 MG tablet Take 1 tablet (10 mg total) by mouth daily. 90 tablet 3  . amphetamine-dextroamphetamine (ADDERALL) 20 MG tablet Take 1 tablet (20 mg total) by mouth daily. Fill in 30 days 30 tablet 0  . amphetamine-dextroamphetamine (ADDERALL) 20 MG tablet Take 1 tablet (20 mg total) by mouth daily. Fill in 60 days 30 tablet 0  . amphetamine-dextroamphetamine (ADDERALL) 20 MG tablet Take 1 tablet (20 mg total) by mouth daily after lunch. Fill now 30 tablet 0  . hydrochlorothiazide (HYDRODIURIL) 25 MG tablet Take 1 tablet (25 mg total) by mouth daily. 90 tablet 3  . lisdexamfetamine (VYVANSE) 40 MG capsule Take 1 capsule (40 mg total) by mouth every morning. Fill in 30 days 30 capsule 0  . lisdexamfetamine (VYVANSE) 40 MG capsule Take 1 capsule (40 mg total) by mouth every morning. Fill in 60 days 30 capsule 0  . lisdexamfetamine (VYVANSE) 40 MG capsule Take 1 capsule (40 mg total) by mouth daily. Fill now 30 capsule 0   No current facility-administered medications for this visit.    No Known Allergies

## 2019-02-09 NOTE — Patient Instructions (Signed)
Thank you for coming in today. You should be able to get your ADHD medicine.  Get and start your CPAP.  Check blood pressure.  Recheck with me in 3-6 months or sooner if needed.  If you have any problems or issues let me know.

## 2019-03-17 ENCOUNTER — Other Ambulatory Visit: Payer: Self-pay | Admitting: Family Medicine

## 2019-03-20 ENCOUNTER — Encounter: Payer: Self-pay | Admitting: Family Medicine

## 2019-05-09 DIAGNOSIS — N3289 Other specified disorders of bladder: Secondary | ICD-10-CM | POA: Diagnosis not present

## 2019-05-09 DIAGNOSIS — R1032 Left lower quadrant pain: Secondary | ICD-10-CM | POA: Diagnosis not present

## 2019-05-09 DIAGNOSIS — K449 Diaphragmatic hernia without obstruction or gangrene: Secondary | ICD-10-CM | POA: Diagnosis not present

## 2019-05-09 DIAGNOSIS — Z79899 Other long term (current) drug therapy: Secondary | ICD-10-CM | POA: Diagnosis not present

## 2019-05-09 DIAGNOSIS — N132 Hydronephrosis with renal and ureteral calculous obstruction: Secondary | ICD-10-CM | POA: Diagnosis not present

## 2019-05-09 DIAGNOSIS — N201 Calculus of ureter: Secondary | ICD-10-CM | POA: Diagnosis not present

## 2019-05-09 DIAGNOSIS — R109 Unspecified abdominal pain: Secondary | ICD-10-CM | POA: Diagnosis not present

## 2020-02-05 ENCOUNTER — Telehealth: Payer: Self-pay | Admitting: Family Medicine

## 2020-02-05 NOTE — Telephone Encounter (Signed)
Will fill at appt on Friday.

## 2020-02-05 NOTE — Telephone Encounter (Signed)
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 Is out of Adderall and Vyvanse. Leaving town Saturday and needs medication and wondering if they can be called in and picked up by Friday.

## 2020-02-05 NOTE — Telephone Encounter (Signed)
No. Must establish and see Dr [name] first

## 2020-02-09 ENCOUNTER — Encounter: Payer: Self-pay | Admitting: Family Medicine

## 2020-02-09 ENCOUNTER — Other Ambulatory Visit: Payer: Self-pay

## 2020-02-09 ENCOUNTER — Ambulatory Visit (INDEPENDENT_AMBULATORY_CARE_PROVIDER_SITE_OTHER): Payer: BC Managed Care – PPO | Admitting: Family Medicine

## 2020-02-09 DIAGNOSIS — I10 Essential (primary) hypertension: Secondary | ICD-10-CM

## 2020-02-09 DIAGNOSIS — F902 Attention-deficit hyperactivity disorder, combined type: Secondary | ICD-10-CM

## 2020-02-09 MED ORDER — AMPHETAMINE-DEXTROAMPHETAMINE 20 MG PO TABS: 20.0000 mg | ORAL_TABLET | Freq: Every day | ORAL | 0 refills | Status: DC

## 2020-02-09 MED ORDER — LISDEXAMFETAMINE DIMESYLATE 40 MG PO CAPS: 40.0000 mg | ORAL_CAPSULE | ORAL | 0 refills | Status: DC

## 2020-02-09 MED ORDER — LISDEXAMFETAMINE DIMESYLATE 40 MG PO CAPS: 40.0000 mg | ORAL_CAPSULE | Freq: Every day | ORAL | 0 refills | Status: DC

## 2020-02-09 MED ORDER — AMLODIPINE BESYLATE 10 MG PO TABS: 10.0000 mg | ORAL_TABLET | Freq: Every day | ORAL | 3 refills | Status: DC

## 2020-02-09 NOTE — Patient Instructions (Signed)
Kathaleen Maser, Wegovy may all be options for weight loss.  Seeing a nutritionist may be helpful as well  Restart amlodipine for BP  Follow up in 3 months

## 2020-02-10 NOTE — Assessment & Plan Note (Signed)
Counseled on weight loss.  Recommend reduced calorie diet and exercise.   Had some success with phentermine previously. Discussed with him that he is already on stimulant medication and phentermine would not be prescribed.  Other options discussed. He will look into these.

## 2020-02-10 NOTE — Assessment & Plan Note (Signed)
Blood pressure is not at goal at for age and co-morbidities.  I recommend that he restart amlodipine 5mg  daily.  In addition they were instructed to follow a low sodium diet with regular exercise to help to maintain adequate control of blood pressure.

## 2020-02-10 NOTE — Assessment & Plan Note (Addendum)
He is doing well with current dosing of adderall in the morning and vyvanse in the afternoon.  No side effects at this time   Continue current prescriptions.  Refills sent in.

## 2020-02-10 NOTE — Progress Notes (Signed)
Dillon Robinson - 34 y.o. male MRN [id number]  Date of birth: 1985-11-25  Subjective Chief Complaint  Patient presents with  . Establish Care    HPI Dillon Robinson is a 34 y.o. male with history of HTN and ADHD here today for initial visit with me and follow up of chronic medical conditions.   -HTN:  He was prescribed amlodipine and HCTZ but has been off of these medications for a couple of months.  He ran out and didn't think he needed to continue.  He was not having side effects from medication.  He is not monitoring BP at home.  He is working to lose weight but has had difficulty.  He is considering wanting to try medications.  He denies symptoms including chest pain, shortness of breath, palpitations, headache or vision changes.    -ADHD:  Current management with adderall in the morning with vyvanse around late morning/lunch time.  This is working well for him.  He denies side effects related to current medication.    ROS:  A comprehensive ROS was completed and negative except as noted per HPI  No Known Allergies  Past Medical History:  Diagnosis Date  . Angioedema 10/21/2016  . HTN (hypertension) 11/24/2016  . OSA (obstructive sleep apnea) 11/24/2016  . Vitamin D deficiency 10/21/2016    History reviewed. No pertinent surgical history.  Social History   Socioeconomic History  . Marital status: Single    Spouse name: Not on file  . Number of children: Not on file  . Years of education: Not on file  . Highest education level: Not on file  Occupational History  . Not on file  Tobacco Use  . Smoking status: Never Smoker  . Smokeless tobacco: Never Used  Substance and Sexual Activity  . Alcohol use: Yes  . Drug use: No  . Sexual activity: Not on file  Other Topics Concern  . Not on file  Social History Narrative  . Not on file   Social Determinants of Health   Financial Resource Strain:   . Difficulty of Paying Living Expenses:   Food Insecurity:   . Worried About Community education officer in the Last Year:   . Barista in the Last Year:   Transportation Needs:   . Freight forwarder (Medical):   Marland Kitchen Lack of Transportation (Non-Medical):   Physical Activity:   . Days of Exercise per Week:   . Minutes of Exercise per Session:   Stress:   . Feeling of Stress :   Social Connections:   . Frequency of Communication with Friends and Family:   . Frequency of Social Gatherings with Friends and Family:   . Attends Religious Services:   . Active Member of Clubs or Organizations:   . Attends Banker Meetings:   Marland Kitchen Marital Status:     Family History  Problem Relation Age of Onset  . Heart failure Father   . Gout Father     Health Maintenance  Topic Date Due  . Hepatitis C Screening  Never done  . COVID-19 Vaccine (1) Never done  . HIV Screening  Never done  . TETANUS/TDAP  Never done  . INFLUENZA VACCINE  02/04/2020     ----------------------------------------------------------------------------------------------------------------------------------------------------------------------------------------------------------------- Physical Exam BP (!) 144/94 (BP Location: Left Arm, Patient Position: Sitting, Cuff Size: Large)   Pulse 82   Temp 98.1 F (36.7 C) (Temporal)   Ht 6' 2.02" (1.88 m)   Wt 297 lb (134.7  kg)   SpO2 99%   BMI 38.12 kg/m   Physical Exam Constitutional:      Appearance: Normal appearance.  Eyes:     General: No scleral icterus. Cardiovascular:     Rate and Rhythm: Normal rate and regular rhythm.  Pulmonary:     Effort: Pulmonary effort is normal.     Breath sounds: Normal breath sounds.  Musculoskeletal:     Cervical back: Neck supple.  Skin:    General: Skin is warm and dry.  Neurological:     General: No focal deficit present.     Mental Status: He is alert.  Psychiatric:        Mood and Affect: Mood normal.        Behavior: Behavior normal.      ------------------------------------------------------------------------------------------------------------------------------------------------------------------------------------------------------------------- Assessment and Plan  ADHD He is doing well with current dosing of adderall in the morning and vyvanse in the afternoon.  No side effects at this time   Continue current prescriptions.  Refills sent in.   HTN (hypertension) Blood pressure is not at goal at for age and co-morbidities.  I recommend that he restart amlodipine 5mg  daily.  In addition they were instructed to follow a low sodium diet with regular exercise to help to maintain adequate control of blood pressure.    Morbid obesity (HCC) Counseled on weight loss.  Recommend reduced calorie diet and exercise.   Had some success with phentermine previously. Discussed with him that he is already on stimulant medication and phentermine would not be prescribed.  Other options discussed. He will look into these.     Meds ordered this encounter  Medications  . amLODipine (NORVASC) 10 MG tablet    Sig: Take 1 tablet (10 mg total) by mouth daily.    Dispense:  90 tablet    Refill:  3  . amphetamine-dextroamphetamine (ADDERALL) 20 MG tablet    Sig: Take 1 tablet (20 mg total) by mouth daily. Fill in 60 days    Dispense:  30 tablet    Refill:  0  . amphetamine-dextroamphetamine (ADDERALL) 20 MG tablet    Sig: Take 1 tablet (20 mg total) by mouth daily after lunch. Fill now    Dispense:  30 tablet    Refill:  0  . amphetamine-dextroamphetamine (ADDERALL) 20 MG tablet    Sig: Take 1 tablet (20 mg total) by mouth daily. Fill in 30 days    Dispense:  30 tablet    Refill:  0  . lisdexamfetamine (VYVANSE) 40 MG capsule    Sig: Take 1 capsule (40 mg total) by mouth every morning. Fill in 30 days    Dispense:  30 capsule    Refill:  0  . lisdexamfetamine (VYVANSE) 40 MG capsule    Sig: Take 1 capsule (40 mg total) by mouth  every morning. Fill in 60 days    Dispense:  30 capsule    Refill:  0  . lisdexamfetamine (VYVANSE) 40 MG capsule    Sig: Take 1 capsule (40 mg total) by mouth daily. Fill now    Dispense:  30 capsule    Refill:  0    Return in about 3 months (around 05/11/2020) for HTN/ADHD.    This visit occurred during the SARS-CoV-2 public health emergency.  Safety protocols were in place, including screening questions prior to the visit, additional usage of staff PPE, and extensive cleaning of exam room while observing appropriate contact time as indicated for disinfecting solutions.

## 2020-05-10 ENCOUNTER — Ambulatory Visit: Payer: BC Managed Care – PPO | Admitting: Family Medicine

## 2020-09-18 ENCOUNTER — Telehealth: Payer: Self-pay

## 2020-09-18 NOTE — Telephone Encounter (Signed)
Pt lvm stating he saw work nurse today for headache. BP: 180/120  He is not taking the prescribed amlodipine.   Can he restart the medication? Or does he need an appt?

## 2020-09-18 NOTE — Telephone Encounter (Signed)
Restart and f/u with me in 2 weeks.

## 2020-09-19 ENCOUNTER — Encounter: Payer: Self-pay | Admitting: Family Medicine

## 2020-09-19 ENCOUNTER — Other Ambulatory Visit: Payer: Self-pay

## 2020-09-19 ENCOUNTER — Ambulatory Visit: Payer: BC Managed Care – PPO | Admitting: Family Medicine

## 2020-09-19 DIAGNOSIS — I10 Essential (primary) hypertension: Secondary | ICD-10-CM

## 2020-09-19 DIAGNOSIS — F902 Attention-deficit hyperactivity disorder, combined type: Secondary | ICD-10-CM | POA: Diagnosis not present

## 2020-09-19 MED ORDER — HYDROCHLOROTHIAZIDE 25 MG PO TABS: 25.0000 mg | ORAL_TABLET | Freq: Every day | ORAL | 3 refills | Status: DC

## 2020-09-19 MED ORDER — AMLODIPINE BESYLATE 10 MG PO TABS: 10.0000 mg | ORAL_TABLET | Freq: Every day | ORAL | 3 refills | Status: DC

## 2020-09-19 NOTE — Assessment & Plan Note (Signed)
Will hold off on restarting medication until BP is back under good control.

## 2020-09-19 NOTE — Patient Instructions (Signed)
Take amlodipine and HCTZ daily Follow a low salt diet.  See me again in 1 month.    https://www.mata.com/.pdf">  DASH Eating Plan DASH stands for Dietary Approaches to Stop Hypertension. The DASH eating plan is a healthy eating plan that has been shown to:  Reduce high blood pressure (hypertension).  Reduce your risk for type 2 diabetes, heart disease, and stroke.  Help with weight loss. What are tips for following this plan? Reading food labels  Check food labels for the amount of salt (sodium) per serving. Choose foods with less than 5 percent of the Daily Value of sodium. Generally, foods with less than 300 milligrams (mg) of sodium per serving fit into this eating plan.  To find whole grains, look for the word "whole" as the first word in the ingredient list. Shopping  Buy products labeled as "low-sodium" or "no salt added."  Buy fresh foods. Avoid canned foods and pre-made or frozen meals. Cooking  Avoid adding salt when cooking. Use salt-free seasonings or herbs instead of table salt or sea salt. Check with your health care provider or pharmacist before using salt substitutes.  Do not fry foods. Connell foods using healthy methods such as baking, boiling, grilling, roasting, and broiling instead.  [place] with heart-healthy oils, such as olive, canola, avocado, soybean, or sunflower oil. Meal planning  Eat a balanced diet that includes: ? 4 or more servings of fruits and 4 or more servings of vegetables each day. Try to fill one-half of your plate with fruits and vegetables. ? 6-8 servings of whole grains each day. ? Less than 6 oz (170 g) of lean meat, poultry, or fish each day. A 3-oz (85-g) serving of meat is about the same size as a deck of cards. One egg equals 1 oz (28 g). ? 2-3 servings of low-fat dairy each day. One serving is 1 cup (237 mL). ? 1 serving of nuts, seeds, or beans 5 times each week. ? 2-3 servings of heart-healthy  fats. Healthy fats called omega-3 fatty acids are found in foods such as walnuts, flaxseeds, fortified milks, and eggs. These fats are also found in cold-water fish, such as sardines, salmon, and mackerel.  Limit how much you eat of: ? Canned or prepackaged foods. ? Food that is high in trans fat, such as some fried foods. ? Food that is high in saturated fat, such as fatty meat. ? Desserts and other sweets, sugary drinks, and other foods with added sugar. ? Full-fat dairy products.  Do not salt foods before eating.  Do not eat more than 4 egg yolks a week.  Try to eat at least 2 vegetarian meals a week.  Eat more home-cooked food and less restaurant, buffet, and fast food.   Lifestyle  When eating at a restaurant, ask that your food be prepared with less salt or no salt, if possible.  If you drink alcohol: ? Limit how much you use to:  0-1 drink a day for women who are not pregnant.  0-2 drinks a day for men. ? Be aware of how much alcohol is in your drink. In the [place] one drink equals one 12 oz bottle of beer (355 mL), one 5 oz glass of wine (148 mL), or one 1 oz glass of hard liquor (44 mL). General information  Avoid eating more than 2,300 mg of salt a day. If you have hypertension, you may need to reduce your sodium intake to 1,500 mg a day.  Work with your  health care provider to maintain a healthy body weight or to lose weight. Ask what an ideal weight is for you.  Get at least 30 minutes of exercise that causes your heart to beat faster (aerobic exercise) most days of the week. Activities may include walking, swimming, or biking.  Work with your health care provider or dietitian to adjust your eating plan to your individual calorie needs. What foods should I eat? Fruits All fresh, dried, or frozen fruit. Canned fruit in natural juice (without added sugar). Vegetables Fresh or frozen vegetables (raw, steamed, roasted, or grilled). Low-sodium or reduced-sodium tomato  and vegetable juice. Low-sodium or reduced-sodium tomato sauce and tomato paste. Low-sodium or reduced-sodium canned vegetables. Grains Whole-grain or whole-wheat bread. Whole-grain or whole-wheat pasta. Brown rice. Orpah Cobb. Bulgur. Whole-grain and low-sodium cereals. Pita bread. Low-fat, low-sodium crackers. Whole-wheat flour tortillas. Meats and other proteins Skinless chicken or [place]. Ground chicken or [place]. Pork with fat trimmed off. Fish and seafood. Egg whites. Dried beans, peas, or lentils. Unsalted nuts, nut butters, and seeds. Unsalted canned beans. Lean cuts of beef with fat trimmed off. Low-sodium, lean precooked or cured meat, such as sausages or meat loaves. Dairy Low-fat (1%) or fat-free (skim) milk. Reduced-fat, low-fat, or fat-free cheeses. Nonfat, low-sodium ricotta or cottage cheese. Low-fat or nonfat yogurt. Low-fat, low-sodium cheese. Fats and oils Soft margarine without trans fats. Vegetable oil. Reduced-fat, low-fat, or light mayonnaise and salad dressings (reduced-sodium). Canola, safflower, olive, avocado, soybean, and sunflower oils. Avocado. Seasonings and condiments Herbs. Spices. Seasoning mixes without salt. Other foods Unsalted popcorn and pretzels. Fat-free sweets. The items listed above may not be a complete list of foods and beverages you can eat. Contact a dietitian for more information. What foods should I avoid? Fruits Canned fruit in a light or heavy syrup. Fried fruit. Fruit in cream or butter sauce. Vegetables Creamed or fried vegetables. Vegetables in a cheese sauce. Regular canned vegetables (not low-sodium or reduced-sodium). Regular canned tomato sauce and paste (not low-sodium or reduced-sodium). Regular tomato and vegetable juice (not low-sodium or reduced-sodium). Rosita Fire. Olives. Grains Baked goods made with fat, such as croissants, muffins, or some breads. Dry pasta or rice meal packs. Meats and other proteins Fatty cuts of meat. Ribs.  Fried meat. Tomasa Blase. Bologna, salami, and other precooked or cured meats, such as sausages or meat loaves. Fat from the back of a pig (fatback). Bratwurst. Salted nuts and seeds. Canned beans with added salt. Canned or smoked fish. Whole eggs or egg yolks. Chicken or [place] with skin. Dairy Whole or 2% milk, cream, and half-and-half. Whole or full-fat cream cheese. Whole-fat or sweetened yogurt. Full-fat cheese. Nondairy creamers. Whipped toppings. Processed cheese and cheese spreads. Fats and oils Butter. Stick margarine. Lard. Shortening. Ghee. Bacon fat. Tropical oils, such as coconut, palm kernel, or palm oil. Seasonings and condiments Onion salt, garlic salt, seasoned salt, table salt, and sea salt. Worcestershire sauce. Tartar sauce. Barbecue sauce. Teriyaki sauce. Soy sauce, including reduced-sodium. Steak sauce. Canned and packaged gravies. Fish sauce. Oyster sauce. Cocktail sauce. Store-bought horseradish. Ketchup. Mustard. Meat flavorings and tenderizers. Bouillon cubes. Hot sauces. Pre-made or packaged marinades. Pre-made or packaged taco seasonings. Relishes. Regular salad dressings. Other foods Salted popcorn and pretzels. The items listed above may not be a complete list of foods and beverages you should avoid. Contact a dietitian for more information. Where to find more information  National Heart, Lung, and Blood Institute: PopSteam.is  American Heart Association: www.heart.org  Academy of Nutrition and Dietetics: www.eatright.org  National Kidney Foundation: www.kidney.org Summary  The DASH eating plan is a healthy eating plan that has been shown to reduce high blood pressure (hypertension). It may also reduce your risk for type 2 diabetes, heart disease, and stroke.  When on the DASH eating plan, aim to eat more fresh fruits and vegetables, whole grains, lean proteins, low-fat dairy, and heart-healthy fats.  With the DASH eating plan, you should limit salt (sodium)  intake to 2,300 mg a day. If you have hypertension, you may need to reduce your sodium intake to 1,500 mg a day.  Work with your health care provider or dietitian to adjust your eating plan to your individual calorie needs. This information is not intended to replace advice given to you by your health care provider. Make sure you discuss any questions you have with your health care provider. Document Revised: 05/26/2019 Document Reviewed: 05/26/2019 Elsevier Patient Education  2021 [employer].

## 2020-09-19 NOTE — Progress Notes (Signed)
Dillon Robinson - 35 y.o. male MRN [id number]  Date of birth: 1986-05-30  Subjective Chief Complaint  Patient presents with  . Hypertension    HPI Dillon Robinson is a 35 y.o. male here today for follow up of HTN and ADHD.  He states that he was at work yesterday and developed a headache.  He was seen by on-site nurse and BP 180/120.  He is prescribed amlodipine and HCTZ but he has not been taking these.  He did take an expired amlodipine yesterday and BP improved some today.  His headache has improved. He denies chest pain, shortness of breath, palpitations, or vision changes.   He has also been out of medications for ADHD however he is aware that we can't restart this until his BP is under control.   ROS:  A comprehensive ROS was completed and negative except as noted per HPI  No Known Allergies  Past Medical History:  Diagnosis Date  . Angioedema 10/21/2016  . HTN (hypertension) 11/24/2016  . OSA (obstructive sleep apnea) 11/24/2016  . Vitamin D deficiency 10/21/2016    History reviewed. No pertinent surgical history.  Social History   Socioeconomic History  . Marital status: Single    Spouse name: Not on file  . Number of children: Not on file  . Years of education: Not on file  . Highest education level: Not on file  Occupational History  . Not on file  Tobacco Use  . Smoking status: Never Smoker  . Smokeless tobacco: Never Used  Substance and Sexual Activity  . Alcohol use: Yes  . Drug use: No  . Sexual activity: Not on file  Other Topics Concern  . Not on file  Social History Narrative  . Not on file   Social Determinants of Health   Financial Resource Strain: Not on file  Food Insecurity: Not on file  Transportation Needs: Not on file  Physical Activity: Not on file  Stress: Not on file  Social Connections: Not on file    Family History  Problem Relation Age of Onset  . Heart failure Father   . Gout Father     Health Maintenance  Topic Date Due  .  Hepatitis C Screening  Never done  . COVID-19 Vaccine (1) Never done  . HIV Screening  Never done  . TETANUS/TDAP  Never done  . INFLUENZA VACCINE  Never done  . HPV VACCINES  Aged Out     ----------------------------------------------------------------------------------------------------------------------------------------------------------------------------------------------------------------- Physical Exam BP (!) 146/87 (BP Location: Left Arm, Patient Position: Sitting, Cuff Size: Large)   Pulse (!) 103   Wt (!) 302 lb (137 kg)   SpO2 99%   BMI 38.76 kg/m   Physical Exam Constitutional:      Appearance: Normal appearance.  HENT:     Head: Normocephalic and atraumatic.  Eyes:     General: No scleral icterus. Cardiovascular:     Rate and Rhythm: Normal rate and regular rhythm.  Pulmonary:     Effort: Pulmonary effort is normal.     Breath sounds: Normal breath sounds.  Musculoskeletal:     Cervical back: Neck supple.  Neurological:     General: No focal deficit present.     Mental Status: He is alert.     ------------------------------------------------------------------------------------------------------------------------------------------------------------------------------------------------------------------- Assessment and Plan  HTN (hypertension) BP still not under control.  He will continue amlodipine at 10mg .  He will also add back on HCTZ 25mg .  New rx for each of these sent in.  Given information  on DASH diet.  See me again in 4 weeks.   ADHD Will hold off on restarting medication until BP is back under good control.    Meds ordered this encounter  Medications  . amLODipine (NORVASC) 10 MG tablet    Sig: Take 1 tablet (10 mg total) by mouth daily.    Dispense:  90 tablet    Refill:  3  . hydrochlorothiazide (HYDRODIURIL) 25 MG tablet    Sig: Take 1 tablet (25 mg total) by mouth daily.    Dispense:  90 tablet    Refill:  3    Return in about 4  weeks (around 10/17/2020) for HTN/ADHD.    This visit occurred during the SARS-CoV-2 public health emergency.  Safety protocols were in place, including screening questions prior to the visit, additional usage of staff PPE, and extensive cleaning of exam room while observing appropriate contact time as indicated for disinfecting solutions.

## 2020-09-19 NOTE — Assessment & Plan Note (Signed)
BP still not under control.  He will continue amlodipine at 10mg .  He will also add back on HCTZ 25mg .  New rx for each of these sent in.  Given information on DASH diet.  See me again in 4 weeks.

## 2020-09-20 NOTE — Telephone Encounter (Signed)
Dillon Robinson states he has an appt scheduled for 4 weeks.   Requested that he keep a log of his daily blood pressures and send them to [place] via MyChart in 2 weeks. He agrees.

## 2020-10-14 ENCOUNTER — Encounter: Payer: Self-pay | Admitting: Family Medicine

## 2020-10-15 ENCOUNTER — Other Ambulatory Visit: Payer: Self-pay | Admitting: Family Medicine

## 2020-10-15 MED ORDER — AMPHETAMINE-DEXTROAMPHETAMINE 20 MG PO TABS: 20.0000 mg | ORAL_TABLET | Freq: Every day | ORAL | 0 refills | Status: DC

## 2020-10-15 MED ORDER — LISDEXAMFETAMINE DIMESYLATE 40 MG PO CAPS: 40.0000 mg | ORAL_CAPSULE | ORAL | 0 refills | Status: DC

## 2020-10-16 ENCOUNTER — Other Ambulatory Visit: Payer: Self-pay | Admitting: Family Medicine

## 2020-10-21 ENCOUNTER — Encounter: Payer: Self-pay | Admitting: Family Medicine

## 2020-10-21 ENCOUNTER — Ambulatory Visit: Payer: BC Managed Care – PPO | Admitting: Family Medicine

## 2020-10-21 ENCOUNTER — Other Ambulatory Visit: Payer: Self-pay

## 2020-10-21 DIAGNOSIS — F902 Attention-deficit hyperactivity disorder, combined type: Secondary | ICD-10-CM | POA: Diagnosis not present

## 2020-10-21 DIAGNOSIS — I10 Essential (primary) hypertension: Secondary | ICD-10-CM | POA: Diagnosis not present

## 2020-10-21 NOTE — Patient Instructions (Signed)
Great to see you! Continue current medications.  See me again in 3 months 

## 2020-10-21 NOTE — Assessment & Plan Note (Signed)
Continues to do well with adderall at current strength.  BP is much better controlled.  Will continue at current dosing.

## 2020-10-21 NOTE — Progress Notes (Signed)
Dillon Robinson - 35 y.o. male MRN [id number]  Date of birth: 09-26-1985  Subjective Chief Complaint  Patient presents with  . Hypertension  . ADHD    HPI Dillon Robinson is a 35 y.o. male here today for follow up of HTN and ADHD.    HTN currently managed with amlodipine and hctz.  BP readings at home have improved with lifestyle change and current medications.  He denies side effects from medication.  He has not had chest pain, shortness of breath, palpitations, headache or vision changes.    Restarted recently as BP is better controlled.  He continues to do well with adderall at current strength.  No side effects at current dose.   ROS:  A comprehensive ROS was completed and negative except as noted per HPI  No Known Allergies  Past Medical History:  Diagnosis Date  . Angioedema 10/21/2016  . HTN (hypertension) 11/24/2016  . OSA (obstructive sleep apnea) 11/24/2016  . Vitamin D deficiency 10/21/2016    History reviewed. No pertinent surgical history.  Social History   Socioeconomic History  . Marital status: Single    Spouse name: Not on file  . Number of children: Not on file  . Years of education: Not on file  . Highest education level: Not on file  Occupational History  . Not on file  Tobacco Use  . Smoking status: Never Smoker  . Smokeless tobacco: Never Used  Substance and Sexual Activity  . Alcohol use: Yes  . Drug use: No  . Sexual activity: Not on file  Other Topics Concern  . Not on file  Social History Narrative  . Not on file   Social Determinants of Health   Financial Resource Strain: Not on file  Food Insecurity: Not on file  Transportation Needs: Not on file  Physical Activity: Not on file  Stress: Not on file  Social Connections: Not on file    Family History  Problem Relation Age of Onset  . Heart failure Father   . Gout Father     Health Maintenance  Topic Date Due  . Hepatitis C Screening  Never done  . HIV Screening  Never done  .  COVID-19 Vaccine (1) 07/06/2021 (Originally 11/05/1988)  . TETANUS/TDAP  10/21/2021 (Originally 11/06/2002)  . INFLUENZA VACCINE  02/03/2021  . HPV VACCINES  Aged Out     ----------------------------------------------------------------------------------------------------------------------------------------------------------------------------------------------------------------- Physical Exam BP 121/79 (BP Location: Left Arm, Patient Position: Sitting, Cuff Size: Large)   Pulse 75   Temp 97.7 F (36.5 C)   Ht 6\' 2"  (1.88 m)   Wt (!) 301 lb (136.5 kg)   SpO2 96%   BMI 38.65 kg/m   Physical Exam Constitutional:      Appearance: Normal appearance.  Eyes:     General: No scleral icterus. Cardiovascular:     Rate and Rhythm: Normal rate and regular rhythm.  Pulmonary:     Effort: Pulmonary effort is normal.     Breath sounds: Normal breath sounds.  Musculoskeletal:     Cervical back: Neck supple.  Neurological:     General: No focal deficit present.     Mental Status: He is alert.  Psychiatric:        Mood and Affect: Mood normal.        Behavior: Behavior normal.     ------------------------------------------------------------------------------------------------------------------------------------------------------------------------------------------------------------------- Assessment and Plan  HTN (hypertension) Blood pressure is at goal at for age and co-morbidities.  I recommend continuation of current medications.  In addition they were  instructed to follow a low sodium diet with regular exercise to help to maintain adequate control of blood pressure.    ADHD Continues to do well with adderall at current strength.  BP is much better controlled.  Will continue at current dosing.     No orders of the defined types were placed in this encounter.   Return in about 3 months (around 01/20/2021) for HTN/ADHD.    This visit occurred during the SARS-CoV-2 public health  emergency.  Safety protocols were in place, including screening questions prior to the visit, additional usage of staff PPE, and extensive cleaning of exam room while observing appropriate contact time as indicated for disinfecting solutions.

## 2020-10-21 NOTE — Assessment & Plan Note (Signed)
Blood pressure is at goal at for age and co-morbidities.  I recommend continuation of current medications.  In addition they were instructed to follow a low sodium diet with regular exercise to help to maintain adequate control of blood pressure.  ? ?

## 2020-11-04 ENCOUNTER — Telehealth: Payer: Self-pay

## 2020-11-04 NOTE — Telephone Encounter (Signed)
Pt lvm stating he "feels like crap". Having body aches, fever, and chills.   Returned patient's call. No answer. LVM requesting patient contact the Triage nurse at [phone] concerning scheduling an appt. In or out of office to be determined by the nurse.

## 2020-11-04 NOTE — Telephone Encounter (Signed)
Patient scheduled for a virtual visit in the morning.

## 2020-11-05 ENCOUNTER — Encounter: Payer: Self-pay | Admitting: Family Medicine

## 2020-11-05 ENCOUNTER — Telehealth (INDEPENDENT_AMBULATORY_CARE_PROVIDER_SITE_OTHER): Payer: BC Managed Care – PPO | Admitting: Family Medicine

## 2020-11-05 DIAGNOSIS — U071 COVID-19: Secondary | ICD-10-CM | POA: Diagnosis not present

## 2020-11-05 MED ORDER — MOLNUPIRAVIR EUA 200MG CAPSULE: 4.0000 | ORAL_CAPSULE | Freq: Two times a day (BID) | ORAL | 0 refills | Status: AC

## 2020-11-05 NOTE — Progress Notes (Signed)
Virtual Video Visit via MyChart Note  I connected with  Dillon Robinson on 11/05/20 at  8:10 AM EDT by the video enabled telemedicine application for MyChart, and verified that I am speaking with the correct person using two identifiers.   I introduced myself as a Publishing rights manager with the practice. We discussed the limitations of evaluation and management by telemedicine and the availability of in person appointments. The patient expressed understanding and agreed to proceed.  Participating parties in this visit include: The patient and the nurse practitioner listed.  The patient is: At home I am: In the office - [facility]  Subjective:    CC: No chief complaint on file.   HPI: Dillon Robinson is a 35 y.o. year old male presenting today via MyChart today for COVID infection.  Patient states that on Sunday night he started to feel bad and tested positive for COVID. He has been having generalized body aches 5/10, chills, fever (102.73F last night), dry cough, extreme fatigue. States he has not noticed any significant shortness of breath, wheezing, chest pain, loss of taste/smell, GI symptoms, urinary/bowel changes. He has been alternating ibuprofen and tylenol which seems to help some temporarily. Reports his wife also has a mild cough, but she has tested negative so far. Describes his symptoms as moderate. Reports he has not had COVID in the past and is not vaccinated. He states his BP has not been checked within the past week or so, but he has been taking his medications and he will monitor it. History of severe sleep apnea, but he has not been using his CPAP - encouraged use.    Past medical history, Surgical history, Family history not pertinant except as noted below, Social history, Allergies, and medications have been entered into the medical record, reviewed, and corrections made.   Review of Systems:  All review of systems negative except what is listed in the  HPI   Objective:    General:  Speaking clearly in complete sentences. Absent shortness of breath noted.   Alert and oriented x3.   Normal judgment.  Absent acute distress. Patient appears very tired.   Impression and Recommendations:    1. COVID-19 Patient is starting day 3 of symptoms and had a positive COVID test yesterday. Given his lack of vaccination, weight, hypertension, and severe sleep apnea, he would be considered high-risk for complications per the CDC guidelines and is eligible for molnupiravir. Discussed with patient and he would like to try it. He requested it be sent to his regular pharmacy, but if unavailable, he will let me know and I can send to [facility]. We also discussed symptoms management and supportive care. Attaching handout to AVS with OTC medications and quarantine guidelines. Encouraged him to monitor is BP, temp, and pulse ox so we can watch for any signs of decline. Rest up and stay hydrated. Educated on signs and symptoms requiring urgent evaluation.  - molnupiravir EUA 200 mg CAPS; Take 4 capsules (800 mg total) by mouth 2 (two) times daily for 5 days.  Dispense: 40 capsule; Refill: 0  Follow-up if symptoms worsen or fail to improve.    I discussed the assessment and treatment plan with the patient. The patient was provided an opportunity to ask questions and all were answered. The patient agreed with the plan and demonstrated an understanding of the instructions.   The patient was advised to call back or seek an in-person evaluation if the symptoms worsen or if the  condition fails to improve as anticipated.  I provided 20 minutes of non-face-to-face interaction with this MYCHART visit including intake, same-day documentation, and chart review.   Clayborne Dana, NP

## 2020-11-05 NOTE — Patient Instructions (Signed)
I'm sorry you aren't feeling well! I've sent in the antiviral (molnupiravir) to [facility]. If they are not able to fill it, let me know and I can send to a [facility] pharmacy for you. Here is the link to the patient information sheet on the medication [email or web address]  Keep a close eye on your symptoms and blood pressure, temperature, and oxygenation level (you can find a pulse ox at the pharmacy or [employer]). Here is a list of COVID information below. Please let us know if you need anything else. I hope you feel better soon!  Over the counter medications that may be helpful for symptoms:  . Guaifenesin 1200 mg extended release tabs twice daily, with plenty of water o For cough and congestion o Brand name: Mucinex   . Pseudoephedrine 30 mg, one or two tabs every 4 to 6 hours o For sinus congestion o Brand name: Sudafed o You must get this from the pharmacy counter.  . Oxymetazoline nasal spray each morning, one spray in each nostril, for NO MORE THAN 3 days  o For nasal and sinus congestion o Brand name: Afrin . Saline nasal spray or Saline Nasal Irrigation 3-5 times a day o For nasal and sinus congestion o Brand names: Ocean or AYR . Fluticasone nasal spray, one spray in each nostril, each morning (after oxymetazoline and saline, if used) o For nasal and sinus congestion o Brand name: Flonase . Warm salt water gargles  o For sore throat o Every few hours as needed . Alternate ibuprofen 400-600 mg and acetaminophen 1000 mg every 4-6 hours o For fever, body aches, headache o Brand names: Motrin or Advil and Tylenol . Dextromethorphan 12-hour cough version 30 mg every 12 hours  o For cough o Brand name: Delsym Stop all other cold medications for now (Nyquil, Dayquil, Tylenol Cold, Theraflu, etc) and other non-prescription cough/cold preparations. Many of these have the same ingredients listed above and could cause an overdose of medication.   Herbal treatments that have  been shown to be helpful in some patients include: Vitamin C 1000mg  per day Vitamin D 4000iU per day Zinc 100mg  per day Quercetin 25-500mg  twice a day Melatonin 5-10mg  at bedtime  General Instructions . Allow your body to rest . Drink PLENTY of fluids . Isolate yourself from everyone, even family, until test results have returned  If your COVID-19 test is positive . Then you ARE INFECTED and you can pass the virus to others . You must quarantine from others for a minimum of  o 10 days since symptoms started AND o You are fever free for 24 hours WITHOUT any medication to reduce fever AND o Your symptoms are improving . Do not go to the store or other public areas . Do not go around household members who are not known to be infected with COVID-19 . If you MUST leave your area of quarantine (example: go to a bathroom you share with others in your home), you must o Wear a mask o Wash your hands thoroughly o Wipe down any surfaces you touch . Do not share food, drinks, towels, or other items with other persons . Dispose of your own tissues, food containers, etc  Once you have recovered, please continue good preventive care measures, including:  . wearing a mask when in public . wash your hands frequently . avoid touching your face/nose/eyes . cover coughs/sneezes with the inside of your elbow . stay out of crowds . keep a 6 foot distance from others  If you develop severe shortness of breath, uncontrolled fevers, coughing up blood, confusion, chest pain, or signs of dehydration (such as significantly decreased urine amounts or dizziness with standing) please go to the ER.

## 2020-11-30 ENCOUNTER — Encounter: Payer: Self-pay | Admitting: Family Medicine

## 2020-12-03 ENCOUNTER — Other Ambulatory Visit: Payer: Self-pay | Admitting: Family Medicine

## 2020-12-03 MED ORDER — AMPHETAMINE-DEXTROAMPHETAMINE 20 MG PO TABS: ORAL_TABLET | ORAL | 0 refills | Status: DC

## 2020-12-03 NOTE — Telephone Encounter (Signed)
#  90 sent in previously for Vyvanse.  Adderall updated to 90 day rx.

## 2020-12-04 ENCOUNTER — Other Ambulatory Visit: Payer: Self-pay

## 2020-12-04 ENCOUNTER — Other Ambulatory Visit: Payer: Self-pay | Admitting: Family Medicine

## 2020-12-04 MED ORDER — AMPHETAMINE-DEXTROAMPHETAMINE 20 MG PO TABS: ORAL_TABLET | ORAL | 0 refills | Status: DC

## 2020-12-04 MED ORDER — LISDEXAMFETAMINE DIMESYLATE 40 MG PO CAPS
40.0000 mg | ORAL_CAPSULE | ORAL | 0 refills | Status: DC
Start: 2020-12-04 — End: 2021-05-13

## 2020-12-04 MED ORDER — LISDEXAMFETAMINE DIMESYLATE 40 MG PO CAPS: 40.0000 mg | ORAL_CAPSULE | ORAL | 0 refills | Status: DC

## 2020-12-04 MED ORDER — AMPHETAMINE-DEXTROAMPHETAMINE 20 MG PO TABS: 20.0000 mg | ORAL_TABLET | Freq: Every day | ORAL | 0 refills | Status: DC

## 2020-12-04 NOTE — Progress Notes (Unsigned)
Pt called requesting 3 different Rx's be sent to pharmacy due to insurance.

## 2020-12-04 NOTE — Progress Notes (Signed)
Completed.

## 2021-01-20 ENCOUNTER — Ambulatory Visit: Payer: BC Managed Care – PPO | Admitting: Family Medicine

## 2021-01-22 ENCOUNTER — Ambulatory Visit: Payer: BC Managed Care – PPO | Admitting: Family Medicine

## 2021-01-22 ENCOUNTER — Other Ambulatory Visit: Payer: Self-pay

## 2021-01-22 ENCOUNTER — Encounter: Payer: Self-pay | Admitting: Family Medicine

## 2021-01-22 VITALS — BP 131/87 | HR 91 | Ht 74.0 in | Wt 297.0 lb

## 2021-01-22 DIAGNOSIS — I10 Essential (primary) hypertension: Secondary | ICD-10-CM

## 2021-01-22 DIAGNOSIS — F902 Attention-deficit hyperactivity disorder, combined type: Secondary | ICD-10-CM | POA: Diagnosis not present

## 2021-01-22 LAB — COMPLETE METABOLIC PANEL WITH GFR
AG Ratio: 1.7 (calc) (ref 1.0–2.5)
ALT: 29 U/L (ref 9–46)
AST: 18 U/L (ref 10–40)
Albumin: 4.8 g/dL (ref 3.6–5.1)
Alkaline phosphatase (APISO): 57 U/L (ref 36–130)
BUN/Creatinine Ratio: 9 (calc) (ref 6–22)
BUN: 12 mg/dL (ref 7–25)
CO2: 30 mmol/L (ref 20–32)
Calcium: 9.5 mg/dL (ref 8.6–10.3)
Chloride: 103 mmol/L (ref 98–110)
Creat: 1.36 mg/dL — ABNORMAL HIGH (ref 0.60–1.26)
Globulin: 2.9 g/dL (calc) (ref 1.9–3.7)
Glucose, Bld: 100 mg/dL — ABNORMAL HIGH (ref 65–99)
Potassium: 3.9 mmol/L (ref 3.5–5.3)
Sodium: 144 mmol/L (ref 135–146)
Total Bilirubin: 0.5 mg/dL (ref 0.2–1.2)
Total Protein: 7.7 g/dL (ref 6.1–8.1)
eGFR: 69 mL/min/{1.73_m2} (ref >=60)

## 2021-01-22 LAB — CBC WITH DIFFERENTIAL/PLATELET
Absolute Monocytes: 428 cells/uL (ref 200–950)
Basophils Absolute: 59 cells/uL (ref 0–200)
Basophils Relative: 0.7 %
Eosinophils Absolute: 84 cells/uL (ref 15–500)
Eosinophils Relative: 1 %
HCT: 45.9 % (ref 38.5–50.0)
Hemoglobin: 15.7 g/dL (ref 13.2–17.1)
Lymphs Abs: 2318 cells/uL (ref 850–3900)
MCH: 28.6 pg (ref 27.0–33.0)
MCHC: 34.2 g/dL (ref 32.0–36.0)
MCV: 83.8 fL (ref 80.0–100.0)
MPV: 10.2 fL (ref 7.5–12.5)
Monocytes Relative: 5.1 %
Neutro Abs: 5510 cells/uL (ref 1500–7800)
Neutrophils Relative %: 65.6 %
Platelets: 254 10*3/uL (ref 140–400)
RBC: 5.48 10*6/uL (ref 4.20–5.80)
RDW: 13.1 % (ref 11.0–15.0)
Total Lymphocyte: 27.6 %
WBC: 8.4 10*3/uL (ref 3.8–10.8)

## 2021-01-22 NOTE — Patient Instructions (Signed)
Continue current medications.  See me again in 3-4 months.  

## 2021-01-22 NOTE — Assessment & Plan Note (Signed)
He continues to do well with combination of Vyvanse in the morning as well as Adderall in the afternoon.  No side effects related to these medications.  We will continue at current strength.  He does not need a refill at this time but will contact me when due for this.

## 2021-01-22 NOTE — Progress Notes (Signed)
Dillon Robinson - 35 y.o. male MRN [id number]  Date of birth: 02-Oct-1985  Subjective Chief Complaint  Patient presents with   Hypertension   ADHD    HPI Dillon Robinson is a 35 year old male here today for follow-up of hypertension as well as ADD.  He reports he is doing well.  He continues to do well with amlodipine and hydrochlorothiazide for management of hypertension.  He denies any side effects related to these medications.  He has not had any chest pain, shortness of breath, palpitations, headache or vision changes.  ADD symptoms remain well controlled with combination of Vyvanse in the morning as well as Adderall as needed in the afternoon.  He has not noted any side effects with this including worsening insomnia, anxiety or palpitations.    ROS:  A comprehensive ROS was completed and negative except as noted per HPI  No Known Allergies  Past Medical History:  Diagnosis Date   Angioedema 10/21/2016   HTN (hypertension) 11/24/2016   OSA (obstructive sleep apnea) 11/24/2016   Vitamin D deficiency 10/21/2016    History reviewed. No pertinent surgical history.  Social History   Socioeconomic History   Marital status: Single    Spouse name: Not on file   Number of children: Not on file   Years of education: Not on file   Highest education level: Not on file  Occupational History   Not on file  Tobacco Use   Smoking status: Never   Smokeless tobacco: Never  Substance and Sexual Activity   Alcohol use: Yes   Drug use: No   Sexual activity: Not on file  Other Topics Concern   Not on file  Social History Narrative   Not on file   Social Determinants of Health   Financial Resource Strain: Not on file  Food Insecurity: Not on file  Transportation Needs: Not on file  Physical Activity: Not on file  Stress: Not on file  Social Connections: Not on file    Family History  Problem Relation Age of Onset   Heart failure Father    Gout Father     Health Maintenance  Topic Date  Due   HIV Screening  Never done   Hepatitis C Screening  Never done   COVID-19 Vaccine (1) 07/06/2021 (Originally 11/05/1988)   TETANUS/TDAP  10/21/2021 (Originally 11/06/2002)   INFLUENZA VACCINE  02/03/2021   Pneumococcal Vaccine 24-50 Years old  Aged Out   HPV VACCINES  Aged Out     ----------------------------------------------------------------------------------------------------------------------------------------------------------------------------------------------------------------- Physical Exam BP 131/87 (BP Location: Left Arm, Patient Position: Sitting, Cuff Size: Large)   Pulse 91   Ht 6\' 2"  (1.88 m)   Wt 297 lb (134.7 kg)   SpO2 97%   BMI 38.13 kg/m   Physical Exam Constitutional:      Appearance: Normal appearance.  Eyes:     General: No scleral icterus. Cardiovascular:     Rate and Rhythm: Normal rate and regular rhythm.  Pulmonary:     Effort: Pulmonary effort is normal.     Breath sounds: Normal breath sounds.  Musculoskeletal:     Cervical back: Neck supple.  Neurological:     General: No focal deficit present.     Mental Status: He is alert.  Psychiatric:        Mood and Affect: Mood normal.        Behavior: Behavior normal.    ------------------------------------------------------------------------------------------------------------------------------------------------------------------------------------------------------------------- Assessment and Plan  HTN (hypertension) Blood pressure remains well controlled at this time.  He will  continue amlodipine and hydrochlorothiazide at current strength.  Updated labs ordered.  ADHD He continues to do well with combination of Vyvanse in the morning as well as Adderall in the afternoon.  No side effects related to these medications.  We will continue at current strength.  He does not need a refill at this time but will contact me when due for this.   No orders of the defined types were placed in this  encounter.   Return in about 3 months (around 04/24/2021) for ADD.    This visit occurred during the SARS-CoV-2 public health emergency.  Safety protocols were in place, including screening questions prior to the visit, additional usage of staff PPE, and extensive cleaning of exam room while observing appropriate contact time as indicated for disinfecting solutions.

## 2021-01-22 NOTE — Assessment & Plan Note (Signed)
Blood pressure remains well controlled at this time.  He will continue amlodipine and hydrochlorothiazide at current strength.  Updated labs ordered.

## 2021-01-22 NOTE — Progress Notes (Signed)
Pt states he had COVID in Early May 2022.

## 2021-03-15 ENCOUNTER — Other Ambulatory Visit: Payer: Self-pay | Admitting: Family Medicine

## 2021-03-17 MED ORDER — AMPHETAMINE-DEXTROAMPHETAMINE 20 MG PO TABS: ORAL_TABLET | ORAL | 0 refills | Status: DC

## 2021-03-17 MED ORDER — LISDEXAMFETAMINE DIMESYLATE 40 MG PO CAPS: 40.0000 mg | ORAL_CAPSULE | ORAL | 0 refills | Status: DC

## 2021-03-22 ENCOUNTER — Encounter: Payer: Self-pay | Admitting: Family Medicine

## 2021-03-24 MED ORDER — LISDEXAMFETAMINE DIMESYLATE 40 MG PO CAPS: 40.0000 mg | ORAL_CAPSULE | ORAL | 0 refills | Status: DC

## 2021-03-24 MED ORDER — AMPHETAMINE-DEXTROAMPHETAMINE 20 MG PO TABS: ORAL_TABLET | ORAL | 0 refills | Status: DC

## 2021-03-24 NOTE — Telephone Encounter (Signed)
RXs sent to pharmacy on 03/17/21 have a pharmacy not to fill in 30 days, not for 30 days.  Please review request and refill if appropriate.  Tiajuana Amass, CMA

## 2021-04-24 ENCOUNTER — Ambulatory Visit: Payer: BC Managed Care – PPO | Admitting: Family Medicine

## 2021-05-13 ENCOUNTER — Other Ambulatory Visit: Payer: Self-pay

## 2021-05-13 ENCOUNTER — Ambulatory Visit: Payer: BC Managed Care – PPO | Admitting: Family Medicine

## 2021-05-13 ENCOUNTER — Encounter: Payer: Self-pay | Admitting: Family Medicine

## 2021-05-13 VITALS — BP 146/88 | HR 83 | Ht 74.0 in | Wt 301.0 lb

## 2021-05-13 DIAGNOSIS — F902 Attention-deficit hyperactivity disorder, combined type: Secondary | ICD-10-CM | POA: Diagnosis not present

## 2021-05-13 DIAGNOSIS — I10 Essential (primary) hypertension: Secondary | ICD-10-CM

## 2021-05-13 DIAGNOSIS — G4733 Obstructive sleep apnea (adult) (pediatric): Secondary | ICD-10-CM

## 2021-05-13 MED ORDER — LISDEXAMFETAMINE DIMESYLATE 40 MG PO CAPS: 40.0000 mg | ORAL_CAPSULE | ORAL | 0 refills | Status: DC

## 2021-05-13 MED ORDER — AMPHETAMINE-DEXTROAMPHETAMINE 20 MG PO TABS: 20.0000 mg | ORAL_TABLET | Freq: Every day | ORAL | 0 refills | Status: DC

## 2021-05-13 MED ORDER — AMPHETAMINE-DEXTROAMPHETAMINE 20 MG PO TABS: ORAL_TABLET | ORAL | 0 refills | Status: DC

## 2021-05-13 NOTE — Assessment & Plan Note (Signed)
Blood pressure is mildly elevated today.  He will continue amlodipine at current strength.  Encouraged low-sodium diet.  We will follow-up on titration sleep study, referral placed.

## 2021-05-13 NOTE — Progress Notes (Signed)
Dillon Robinson - 35 y.o. male MRN [id number]  Date of birth: [birth year]  Subjective Chief Complaint  Patient presents with   Obstructive Sleep Apnea    HPI Dillon Robinson is a 35 year old male here today for follow-up of ADD and hypertension.  Reports he is doing well with current dose of Vyvanse with Adderall in the afternoon.  He has not noted any significant side effects with this.  He has had good control symptoms with current medications.  Reports that he is doing well with current dose of amlodipine for management of hypertension.  He does have history of sleep apnea.  Titration study was recommended previously however it appears he had declined this.  He would like to proceed with having this done due to increased snoring and fatigue.  ROS:  A comprehensive ROS was completed and negative except as noted per HPI  No Known Allergies  Past Medical History:  Diagnosis Date   Angioedema 10/21/2016   HTN (hypertension) 11/24/2016   OSA (obstructive sleep apnea) 11/24/2016   Vitamin D deficiency 10/21/2016    History reviewed. No pertinent surgical history.  Social History   Socioeconomic History   Marital status: Single    Spouse name: Not on file   Number of children: Not on file   Years of education: Not on file   Highest education level: Not on file  Occupational History   Not on file  Tobacco Use   Smoking status: Never   Smokeless tobacco: Never  Substance and Sexual Activity   Alcohol use: Yes   Drug use: No   Sexual activity: Not on file  Other Topics Concern   Not on file  Social History Narrative   Not on file   Social Determinants of Health   Financial Resource Strain: Not on file  Food Insecurity: Not on file  Transportation Needs: Not on file  Physical Activity: Not on file  Stress: Not on file  Social Connections: Not on file    Family History  Problem Relation Age of Onset   Heart failure Father    Gout Father     Health Maintenance  Topic Date Due    HIV Screening  Never done   Hepatitis C Screening  Never done   COVID-19 Vaccine (1) 07/06/2021 (Originally [birth year]/08/1984)   INFLUENZA VACCINE  10/03/2021 (Originally 02/03/2021)   TETANUS/TDAP  10/21/2021 (Originally 11/06/2002)   Pneumococcal Vaccine 37-44 Years old  Aged Out   HPV VACCINES  Aged Out     ----------------------------------------------------------------------------------------------------------------------------------------------------------------------------------------------------------------- Physical Exam BP (!) 146/88 (BP Location: Left Arm, Patient Position: Sitting, Cuff Size: Large)   Pulse 83   Ht 6\' 2"  (1.88 m)   Wt (!) 301 lb (136.5 kg)   SpO2 97%   BMI 38.65 kg/m   Physical Exam Constitutional:      Appearance: Normal appearance.  HENT:     Head: Normocephalic and atraumatic.  Eyes:     General: No scleral icterus. Cardiovascular:     Rate and Rhythm: Normal rate and regular rhythm.     Pulses: Normal pulses.     Heart sounds: Normal heart sounds.  Musculoskeletal:     Cervical back: Neck supple.  Neurological:     General: No focal deficit present.     Mental Status: He is alert.  Psychiatric:        Mood and Affect: Mood normal.        Behavior: Behavior normal.    ------------------------------------------------------------------------------------------------------------------------------------------------------------------------------------------------------------------- Assessment and Plan  HTN (hypertension) Blood  pressure is mildly elevated today.  He will continue amlodipine at current strength.  Encouraged low-sodium diet.  We will follow-up on titration sleep study, referral placed.  ADHD He is doing well with current dose of Vyvanse and Adderall.  We will continue.Return in about 3 months (around 08/13/2021) for ADHD/HTN.   Meds ordered this encounter  Medications   lisdexamfetamine (VYVANSE) 40 MG capsule    Sig: Take 1 capsule  (40 mg total) by mouth every morning.    Dispense:  30 capsule    Refill:  0    Fill in 30 days   lisdexamfetamine (VYVANSE) 40 MG capsule    Sig: Take 1 capsule (40 mg total) by mouth every morning. Fill in 60 days    Dispense:  30 capsule    Refill:  0   lisdexamfetamine (VYVANSE) 40 MG capsule    Sig: Take 1 capsule (40 mg total) by mouth every morning. Fill now    Dispense:  30 capsule    Refill:  0   amphetamine-dextroamphetamine (ADDERALL) 20 MG tablet    Sig: TAKE 1 TABLET BY MOUTH ONCE DAILY. TAKE IN THE AFTERNOON.    Dispense:  30 tablet    Refill:  0    Fill in 30 days   amphetamine-dextroamphetamine (ADDERALL) 20 MG tablet    Sig: Take 1 tablet (20 mg total) by mouth daily. Take in the afternoon    Dispense:  30 tablet    Refill:  0    Fill in 60 days   amphetamine-dextroamphetamine (ADDERALL) 20 MG tablet    Sig: Take 1 tablet (20 mg total) by mouth daily. Take in the afternoon    Dispense:  30 tablet    Refill:  0    Fill now    Return in about 3 months (around 08/13/2021) for ADHD/HTN.    This visit occurred during the SARS-CoV-2 public health emergency.  Safety protocols were in place, including screening questions prior to the visit, additional usage of staff PPE, and extensive cleaning of exam room while observing appropriate contact time as indicated for disinfecting solutions.

## 2021-05-13 NOTE — Assessment & Plan Note (Addendum)
He is doing well with current dose of Vyvanse and Adderall.  We will continue.Return in about 3 months (around 08/13/2021) for ADHD/HTN.

## 2021-08-13 ENCOUNTER — Encounter: Payer: Self-pay | Admitting: Family Medicine

## 2021-08-13 ENCOUNTER — Other Ambulatory Visit: Payer: Self-pay

## 2021-08-13 ENCOUNTER — Ambulatory Visit: Payer: BC Managed Care – PPO | Admitting: Family Medicine

## 2021-08-13 DIAGNOSIS — F902 Attention-deficit hyperactivity disorder, combined type: Secondary | ICD-10-CM

## 2021-08-13 MED ORDER — METHYLPHENIDATE HCL 20 MG PO TABS: ORAL_TABLET | ORAL | 0 refills | Status: DC

## 2021-08-13 NOTE — Assessment & Plan Note (Signed)
We will plan to continue Vyvanse at current strength.  Adding methylphenidate to use in the afternoon as needed due to Adderall shortage.

## 2021-08-13 NOTE — Progress Notes (Signed)
Dillon Robinson - 36 y.o. male MRN [id number]  Date of birth: Aug 20, 1985  Subjective Chief Complaint  Patient presents with   ADHD    HPI Dillon Robinson is a 36 year old male here today for follow-up of ADHD.  This has been treated with Vyvanse in the morning with an additional Adderall in the afternoon as needed.  He has had difficulty filling Adderall due to shortages.  He would like to discuss other options which may be available to manage his symptoms during this shortage of medication.  He has not had any difficulty obtaining Vyvanse.  ROS:  A comprehensive ROS was completed and negative except as noted per HPI  No Known Allergies  Past Medical History:  Diagnosis Date   Angioedema 10/21/2016   HTN (hypertension) 11/24/2016   OSA (obstructive sleep apnea) 11/24/2016   Vitamin D deficiency 10/21/2016    History reviewed. No pertinent surgical history.  Social History   Socioeconomic History   Marital status: Single    Spouse name: Not on file   Number of children: Not on file   Years of education: Not on file   Highest education level: Not on file  Occupational History   Not on file  Tobacco Use   Smoking status: Never   Smokeless tobacco: Never  Substance and Sexual Activity   Alcohol use: Yes   Drug use: No   Sexual activity: Not on file  Other Topics Concern   Not on file  Social History Narrative   Not on file   Social Determinants of Health   Financial Resource Strain: Not on file  Food Insecurity: Not on file  Transportation Needs: Not on file  Physical Activity: Not on file  Stress: Not on file  Social Connections: Not on file    Family History  Problem Relation Age of Onset   Heart failure Father    Gout Father     Health Maintenance  Topic Date Due   HIV Screening  Never done   Hepatitis C Screening  Never done   INFLUENZA VACCINE  10/03/2021 (Originally 02/03/2021)   TETANUS/TDAP  10/21/2021 (Originally 11/06/2002)   HPV VACCINES  Aged Out   COVID-19  Vaccine  Discontinued     ----------------------------------------------------------------------------------------------------------------------------------------------------------------------------------------------------------------- Physical Exam BP 127/88 (BP Location: Left Arm, Patient Position: Sitting, Cuff Size: Large)    Pulse 67    Temp 98.1 F (36.7 C)    Ht 6\' 2"  (1.88 m)    Wt 296 lb (134.3 kg)    SpO2 100%    BMI 38.00 kg/m   Physical Exam Constitutional:      Appearance: Normal appearance.  Eyes:     General: No scleral icterus. Cardiovascular:     Rate and Rhythm: Normal rate and regular rhythm.  Pulmonary:     Effort: Pulmonary effort is normal.     Breath sounds: Normal breath sounds.  Musculoskeletal:     Cervical back: Neck supple.  Neurological:     Mental Status: He is alert.  Psychiatric:        Mood and Affect: Mood normal.        Behavior: Behavior normal.    ------------------------------------------------------------------------------------------------------------------------------------------------------------------------------------------------------------------- Assessment and Plan  ADHD We will plan to continue Vyvanse at current strength.  Adding methylphenidate to use in the afternoon as needed due to Adderall shortage.   Meds ordered this encounter  Medications   methylphenidate (RITALIN) 20 MG tablet    Sig: Take 1 tab PO in the afternoon. Replacing adderall due to  shortage.    Dispense:  30 tablet    Refill:  0   methylphenidate (RITALIN) 20 MG tablet    Sig: Take 1 tab PO in the afternoon    Dispense:  30 tablet    Refill:  0    Fill in 30 days   methylphenidate (RITALIN) 20 MG tablet    Sig: Take 1 tab PO in the afternoon.    Dispense:  30 tablet    Refill:  0    Fill in 60 days.    Return in about 6 months (around 02/10/2022) for ADHD.    This visit occurred during the SARS-CoV-2 public health emergency.  Safety protocols  were in place, including screening questions prior to the visit, additional usage of staff PPE, and extensive cleaning of exam room while observing appropriate contact time as indicated for disinfecting solutions.

## 2021-08-13 NOTE — Patient Instructions (Signed)
Let's replace adderall with methylphenidate for now.  Continue vyvanse at current strength.  See me again in 6 months or sooner if needed.

## 2021-09-29 ENCOUNTER — Encounter: Payer: Self-pay | Admitting: Family Medicine

## 2021-12-22 ENCOUNTER — Telehealth: Payer: Self-pay | Admitting: General Practice

## 2021-12-22 NOTE — Telephone Encounter (Signed)
Transition Care Management Unsuccessful Follow-up Telephone Call  Date of discharge and from where:  12/18/21 from [facility]  Attempts:  1st Attempt  Reason for unsuccessful TCM follow-up call:  Left voice message    

## 2021-12-24 NOTE — Telephone Encounter (Signed)
Transition Care Management Unsuccessful Follow-up Telephone Call  Date of discharge and from where:  12/18/21 from [facility]  Attempts:  2nd Attempt  Reason for unsuccessful TCM follow-up call:  Left voice message

## 2021-12-25 NOTE — Telephone Encounter (Signed)
Transition Care Management Unsuccessful Follow-up Telephone Call  Date of discharge and from where:  12/18/21 from [facility]  Attempts:  3rd Attempt  Reason for unsuccessful TCM follow-up call:  Left voice message

## 2022-02-11 ENCOUNTER — Ambulatory Visit: Payer: BC Managed Care – PPO | Admitting: Family Medicine

## 2022-02-11 ENCOUNTER — Encounter: Payer: Self-pay | Admitting: Family Medicine

## 2022-02-11 DIAGNOSIS — F902 Attention-deficit hyperactivity disorder, combined type: Secondary | ICD-10-CM | POA: Diagnosis not present

## 2022-02-11 DIAGNOSIS — I10 Essential (primary) hypertension: Secondary | ICD-10-CM

## 2022-02-11 MED ORDER — METHYLPHENIDATE HCL 20 MG PO TABS
ORAL_TABLET | ORAL | 0 refills | Status: DC
Start: 2022-02-11 — End: 2022-07-23

## 2022-02-11 MED ORDER — LISDEXAMFETAMINE DIMESYLATE 40 MG PO CAPS: 40.0000 mg | ORAL_CAPSULE | ORAL | 0 refills | Status: DC

## 2022-02-11 NOTE — Progress Notes (Signed)
Dillon Robinson - 36 y.o. male MRN [id number]  Date of birth: [birth year]  Subjective Chief Complaint  Patient presents with   Follow-up    HPI Dillon Robinson is a 36 year old male here today for follow-up visit.  Reports he is doing well at this time.  Has not had Vyvanse in the past couple months due to stock issues.  He does feel that methylphenidate has worked better than Adderall for his afternoon dose.  He has not had any side effects from medication.  Denies increased anxiety or sleep difficulty.  Blood pressures remain well-controlled.  He has no longer taking amlodipine.    ROS:  A comprehensive ROS was completed and negative except as noted per HPI   No Known Allergies  Past Medical History:  Diagnosis Date   Angioedema 10/21/2016   HTN (hypertension) 11/24/2016   OSA (obstructive sleep apnea) 11/24/2016   Vitamin D deficiency 10/21/2016    History reviewed. No pertinent surgical history.  Social History   Socioeconomic History   Marital status: Single    Spouse name: Not on file   Number of children: Not on file   Years of education: Not on file   Highest education level: Not on file  Occupational History   Not on file  Tobacco Use   Smoking status: Never   Smokeless tobacco: Never  Substance and Sexual Activity   Alcohol use: Yes   Drug use: No   Sexual activity: Not on file  Other Topics Concern   Not on file  Social History Narrative   Not on file   Social Determinants of Health   Financial Resource Strain: Not on file  Food Insecurity: Not on file  Transportation Needs: Not on file  Physical Activity: Not on file  Stress: Not on file  Social Connections: Not on file    Family History  Problem Relation Age of Onset   Heart failure Father    Gout Father     Health Maintenance  Topic Date Due   INFLUENZA VACCINE  02/03/2022   TETANUS/TDAP  02/12/2023 (Originally 11/06/2002)   Hepatitis C Screening  02/12/2023 (Originally 11/05/2001)   HIV Screening   02/12/2023 (Originally 11/06/1998)   HPV VACCINES  Aged Out   COVID-19 Vaccine  Discontinued     ----------------------------------------------------------------------------------------------------------------------------------------------------------------------------------------------------------------- Physical Exam BP 124/85 (BP Location: Right Arm, Patient Position: Sitting, Cuff Size: Large)   Pulse 81   Ht 6\' 2"  (1.88 m)   Wt 298 lb (135.2 kg)   SpO2 97%   BMI 38.26 kg/m   Physical Exam Constitutional:      Appearance: Normal appearance.  Eyes:     General: No scleral icterus. Musculoskeletal:     Cervical back: Neck supple.  Neurological:     Mental Status: He is alert.  Psychiatric:        Mood and Affect: Mood normal.        Behavior: Behavior normal.     ------------------------------------------------------------------------------------------------------------------------------------------------------------------------------------------------------------------- Assessment and Plan  HTN (hypertension) Blood pressure stable at this time.  No longer taking amlodipine.  Continue HCTZ at current strength.  ADHD Updating prescription for Vyvanse.  He will let me know if this is not in stock in his local pharmacy.  Will continue methylphenidate at current strength to use in the afternoon as needed.   Meds ordered this encounter  Medications   lisdexamfetamine (VYVANSE) 40 MG capsule    Sig: Take 1 capsule (40 mg total) by mouth every morning.    Dispense:  30 capsule    Refill:  0    Fill in 30 days   lisdexamfetamine (VYVANSE) 40 MG capsule    Sig: Take 1 capsule (40 mg total) by mouth every morning. Fill in 60 days    Dispense:  30 capsule    Refill:  0   lisdexamfetamine (VYVANSE) 40 MG capsule    Sig: Take 1 capsule (40 mg total) by mouth every morning. Fill now    Dispense:  30 capsule    Refill:  0   methylphenidate (RITALIN) 20 MG tablet    Sig: Take  1 tab PO in the afternoon. Replacing adderall due to shortage.    Dispense:  30 tablet    Refill:  0   methylphenidate (RITALIN) 20 MG tablet    Sig: Take 1 tab PO in the afternoon    Dispense:  30 tablet    Refill:  0    Fill in 30 days   methylphenidate (RITALIN) 20 MG tablet    Sig: Take 1 tab PO in the afternoon.    Dispense:  30 tablet    Refill:  0    Fill in 60 days.    Return in about 6 months (around 08/14/2022) for ADD.    This visit occurred during the SARS-CoV-2 public health emergency.  Safety protocols were in place, including screening questions prior to the visit, additional usage of staff PPE, and extensive cleaning of exam room while observing appropriate contact time as indicated for disinfecting solutions.

## 2022-02-11 NOTE — Assessment & Plan Note (Signed)
Blood pressure stable at this time.  No longer taking amlodipine.  Continue HCTZ at current strength.

## 2022-02-11 NOTE — Assessment & Plan Note (Signed)
Updating prescription for Vyvanse.  He will let me know if this is not in stock in his local pharmacy.  Will continue methylphenidate at current strength to use in the afternoon as needed.

## 2022-02-11 NOTE — Patient Instructions (Signed)
Mediterranean Diet ?A Mediterranean diet refers to food and lifestyle choices that are based on the traditions of countries located on the [place] Sea. It focuses on eating more fruits, vegetables, whole grains, beans, nuts, seeds, and heart-healthy fats, and eating less dairy, meat, eggs, and processed foods with added sugar, salt, and fat. This way of eating has been shown to help prevent certain conditions and improve outcomes for people who have chronic diseases, like kidney disease and heart disease. ?What are tips for following this plan? ?Reading food labels ?Check the serving size of packaged foods. For foods such as rice and pasta, the serving size refers to the amount of cooked product, not dry. ?Check the total fat in packaged foods. Avoid foods that have saturated fat or trans fats. ?Check the ingredient list for added sugars, such as corn syrup. ?Shopping ? ?Buy a variety of foods that offer a balanced diet, including: ?Fresh fruits and vegetables (produce). ?Grains, beans, nuts, and seeds. Some of these may be available in unpackaged forms or large amounts (in bulk). ?Fresh seafood. ?Poultry and eggs. ?Low-fat dairy products. ?Buy whole ingredients instead of prepackaged foods. ?Buy fresh fruits and vegetables in-season from local farmers markets. ?Buy plain frozen fruits and vegetables. ?If you do not have access to quality fresh seafood, buy precooked frozen shrimp or canned fish, such as tuna, salmon, or sardines. ?Stock your pantry so you always have certain foods on hand, such as olive oil, canned tuna, canned tomatoes, rice, pasta, and beans. ?Cooking ?[place]weather foods with extra-virgin olive oil instead of using butter or other vegetable oils. ?Have meat as a side dish, and have vegetables or grains as your main dish. This means having meat in small portions or adding small amounts of meat to foods like pasta or stew. ?Use beans or vegetables instead of meat in common dishes like chili or  lasagna. ?Experiment with different cooking methods. Try roasting, broiling, steaming, and saut?ing vegetables. ?Add frozen vegetables to soups, stews, pasta, or rice. ?Add nuts or seeds for added healthy fats and plant protein at each meal. You can add these to yogurt, salads, or vegetable dishes. ?Marinate fish or vegetables using olive oil, lemon juice, garlic, and fresh herbs. ?Meal planning ?Plan to eat one vegetarian meal one day each week. Try to work up to two vegetarian meals, if possible. ?Eat seafood two or more times a week. ?Have healthy snacks readily available, such as: ?Vegetable sticks with hummus. ?Greek yogurt. ?Fruit and nut trail mix. ?Eat balanced meals throughout the week. This includes: ?Fruit: 2-3 servings a day. ?Vegetables: 4-5 servings a day. ?Low-fat dairy: 2 servings a day. ?Fish, poultry, or lean meat: 1 serving a day. ?Beans and legumes: 2 or more servings a week. ?Nuts and seeds: 1-2 servings a day. ?Whole grains: 6-8 servings a day. ?Extra-virgin olive oil: 3-4 servings a day. ?Limit red meat and sweets to only a few servings a month. ?Lifestyle ? ?Bree and eat meals together with your family, when possible. ?Drink enough fluid to keep your urine pale yellow. ?Be physically active every day. This includes: ?Aerobic exercise like running or swimming. ?Leisure activities like gardening, walking, or housework. ?Get 7-8 hours of sleep each night. ?If recommended by your health care provider, drink red wine in moderation. This means 1 glass a day for nonpregnant women and 2 glasses a day for men. A glass of wine equals 5 oz (150 mL). ?What foods should I eat? ?Fruits ?Apples. Apricots. Avocado. Berries. Bananas. Cherries. Dates.   Figs. Grapes. Lemons. Melon. Oranges. Peaches. Plums. Pomegranate. ?Vegetables ?Artichokes. Beets. Broccoli. Cabbage. Carrots. Eggplant. Green beans. Chard. Kale. Spinach. Onions. Leeks. Peas. Squash. Tomatoes. Peppers. Radishes. ?Grains ?Whole-grain pasta. Brown  rice. Bulgur wheat. Polenta. Couscous. Whole-wheat bread. Oatmeal. Quinoa. ?Meats and other proteins ?Beans. Almonds. Sunflower seeds. Pine nuts. Peanuts. Cod. Salmon. Scallops. Shrimp. Tuna. Tilapia. Clams. Oysters. Eggs. Poultry without skin. ?Dairy ?Low-fat milk. Cheese. Greek yogurt. ?Fats and oils ?Extra-virgin olive oil. Avocado oil. Grapeseed oil. ?Beverages ?Water. Red wine. Herbal tea. ?Sweets and desserts ?Greek yogurt with honey. Baked apples. Poached pears. Trail mix. ?Seasonings and condiments ?Basil. Cilantro. Coriander. Cumin. Mint. Parsley. Sage. Rosemary. Tarragon. Garlic. Oregano. Thyme. Pepper. Balsamic vinegar. Tahini. Hummus. Tomato sauce. Olives. Mushrooms. ?The items listed above may not be a complete list of foods and beverages you can eat. Contact a dietitian for more information. ?What foods should I limit? ?This is a list of foods that should be eaten rarely or only on special occasions. ?Fruits ?Fruit canned in syrup. ?Vegetables ?Deep-fried potatoes (french fries). ?Grains ?Prepackaged pasta or rice dishes. Prepackaged cereal with added sugar. Prepackaged snacks with added sugar. ?Meats and other proteins ?Beef. Pork. Lamb. Poultry with skin. Hot dogs. Bacon. ?Dairy ?Ice cream. Sour cream. Whole milk. ?Fats and oils ?Butter. Canola oil. Vegetable oil. Beef fat (tallow). Lard. ?Beverages ?Juice. Sugar-sweetened soft drinks. Beer. Liquor and spirits. ?Sweets and desserts ?Cookies. Cakes. Pies. Candy. ?Seasonings and condiments ?Mayonnaise. Pre-made sauces and marinades. ?The items listed above may not be a complete list of foods and beverages you should limit. Contact a dietitian for more information. ?Summary ?The Mediterranean diet includes both food and lifestyle choices. ?Eat a variety of fresh fruits and vegetables, beans, nuts, seeds, and whole grains. ?Limit the amount of red meat and sweets that you eat. ?If recommended by your health care provider, drink red wine in moderation.  This means 1 glass a day for nonpregnant women and 2 glasses a day for men. A glass of wine equals 5 oz (150 mL). ?This information is not intended to replace advice given to you by your health care provider. Make sure you discuss any questions you have with your health care provider. ?Document Revised: 07/28/2019 Document Reviewed: 05/25/2019 ?Elsevier Patient Education ? 2023 [employer] ? ?

## 2022-07-23 ENCOUNTER — Other Ambulatory Visit: Payer: Self-pay | Admitting: Family Medicine

## 2022-07-23 ENCOUNTER — Ambulatory Visit: Payer: BC Managed Care – PPO | Admitting: Family Medicine

## 2022-07-23 ENCOUNTER — Encounter: Payer: Self-pay | Admitting: Family Medicine

## 2022-07-23 DIAGNOSIS — I10 Essential (primary) hypertension: Secondary | ICD-10-CM | POA: Diagnosis not present

## 2022-07-23 DIAGNOSIS — F902 Attention-deficit hyperactivity disorder, combined type: Secondary | ICD-10-CM

## 2022-07-23 MED ORDER — LISDEXAMFETAMINE DIMESYLATE 40 MG PO CAPS: 40.0000 mg | ORAL_CAPSULE | ORAL | 0 refills | Status: DC

## 2022-07-23 MED ORDER — ZEPBOUND 2.5 MG/0.5ML ~~LOC~~ SOAJ: 2.5000 mg | SUBCUTANEOUS | 0 refills | Status: DC

## 2022-07-23 MED ORDER — METHYLPHENIDATE HCL 20 MG PO TABS: ORAL_TABLET | ORAL | 0 refills | Status: DC

## 2022-07-23 MED ORDER — ZEPBOUND 7.5 MG/0.5ML ~~LOC~~ SOAJ: 7.5000 mg | SUBCUTANEOUS | 1 refills | Status: DC

## 2022-07-23 MED ORDER — ZEPBOUND 5 MG/0.5ML ~~LOC~~ SOAJ: 5.0000 mg | SUBCUTANEOUS | 0 refills | Status: DC

## 2022-07-23 MED ORDER — METHYLPHENIDATE HCL 20 MG PO TABS
ORAL_TABLET | ORAL | 0 refills | Status: DC
Start: 2022-07-23 — End: 2022-07-27

## 2022-07-23 MED ORDER — LISDEXAMFETAMINE DIMESYLATE 40 MG PO CAPS
40.0000 mg | ORAL_CAPSULE | ORAL | 0 refills | Status: DC
Start: 2022-07-23 — End: 2023-01-21

## 2022-07-23 NOTE — Assessment & Plan Note (Signed)
We discussed options for management of weight.  Adding Zepbound and will titrate monthly as tolerated.  Given coupon for co-pay.

## 2022-07-23 NOTE — Assessment & Plan Note (Signed)
He is doing well with combination of Vyvanse and methylphenidate.  Will plan to continue these at current strength.

## 2022-07-23 NOTE — Assessment & Plan Note (Signed)
Blood pressure is fairly well-controlled.  He has discontinued hydrochlorothiazide at this time.  He will continue to monitor.

## 2022-07-23 NOTE — Progress Notes (Signed)
Dillon Robinson - 37 y.o. male MRN [id number]  Date of birth: 30-May-1986  Subjective Chief Complaint  Patient presents with   Medication Refill    HPI Dillon Robinson is a 37 year old male here today for follow-up visit.  Dillon Robinson reports Dillon Robinson is doing well with combination of Vyvanse 40 mg daily as well as methylphenidate 20 mg in the afternoon.  Dillon Robinson has not noted any significant side effects from medication.  Dillon Robinson has history of coexisting hypertension however blood pressure remains pretty well-controlled.  Dillon Robinson has discontinued hydrochlorothiazide for management of his hypertension.  Dillon Robinson denies chest pain, shortness of breath, insomnia, headaches or increased anxiety.  Dillon Robinson is interested in adding medication to help with weight loss.  Dillon Robinson has looked into GLP-1 medications and is interested in trying one of these.  Dillon Robinson does try to stay pretty active however feels like appetite is not well-controlled.  ROS:  A comprehensive ROS was completed and negative except as noted per HPI  No Known Allergies  Past Medical History:  Diagnosis Date   Angioedema 10/21/2016   HTN (hypertension) 11/24/2016   OSA (obstructive sleep apnea) 11/24/2016   Vitamin D deficiency 10/21/2016    History reviewed. No pertinent surgical history.  Social History   Socioeconomic History   Marital status: Single    Spouse name: Not on file   Number of children: Not on file   Years of education: Not on file   Highest education level: Not on file  Occupational History   Not on file  Tobacco Use   Smoking status: Never   Smokeless tobacco: Never  Substance and Sexual Activity   Alcohol use: Yes   Drug use: No   Sexual activity: Not on file  Other Topics Concern   Not on file  Social History Narrative   Not on file   Social Determinants of Health   Financial Resource Strain: Not on file  Food Insecurity: Not on file  Transportation Needs: Not on file  Physical Activity: Not on file  Stress: Not on file  Social Connections:  Not on file    Family History  Problem Relation Age of Onset   Heart failure Father    Gout Father     Health Maintenance  Topic Date Due   DTaP/Tdap/Td (1 - Tdap) Never done   INFLUENZA VACCINE  10/04/2022 (Originally 02/03/2022)   Hepatitis C Screening  02/12/2023 (Originally 11/05/2001)   HIV Screening  02/12/2023 (Originally 11/06/1998)   HPV VACCINES  Aged Out   COVID-19 Vaccine  Discontinued     ----------------------------------------------------------------------------------------------------------------------------------------------------------------------------------------------------------------- Physical Exam BP 134/88 (BP Location: Left Arm, Patient Position: Sitting, Cuff Size: Large)   Pulse 92   Ht 6\' 2"  (1.88 m)   Wt (!) 307 lb (139.3 kg)   SpO2 98%   BMI 39.42 kg/m   Physical Exam Constitutional:      Appearance: Normal appearance.  Eyes:     General: No scleral icterus. Cardiovascular:     Rate and Rhythm: Normal rate and regular rhythm.  Pulmonary:     Effort: Pulmonary effort is normal.     Breath sounds: Normal breath sounds.  Musculoskeletal:     Cervical back: Neck supple.  Neurological:     Mental Status: Dillon Robinson is alert.  Psychiatric:        Mood and Affect: Mood normal.        Behavior: Behavior normal.     ------------------------------------------------------------------------------------------------------------------------------------------------------------------------------------------------------------------- Assessment and Plan  Morbid obesity ([place]) We discussed options for management  of weight.  Adding Zepbound and will titrate monthly as tolerated.  Given coupon for co-pay.  HTN (hypertension) Blood pressure is fairly well-controlled.  Dillon Robinson has discontinued hydrochlorothiazide at this time.  Dillon Robinson will continue to monitor.  ADHD Dillon Robinson is doing well with combination of Vyvanse and methylphenidate.  Will plan to continue these at current  strength.   Meds ordered this encounter  Medications   lisdexamfetamine (VYVANSE) 40 MG capsule    Sig: Take 1 capsule (40 mg total) by mouth every morning.    Dispense:  30 capsule    Refill:  0    Fill in 30 days   lisdexamfetamine (VYVANSE) 40 MG capsule    Sig: Take 1 capsule (40 mg total) by mouth every morning. Fill in 60 days    Dispense:  30 capsule    Refill:  0   lisdexamfetamine (VYVANSE) 40 MG capsule    Sig: Take 1 capsule (40 mg total) by mouth every morning. Fill now    Dispense:  30 capsule    Refill:  0   methylphenidate (RITALIN) 20 MG tablet    Sig: Take 1 tab PO in the afternoon. Replacing adderall due to shortage.    Dispense:  30 tablet    Refill:  0   methylphenidate (RITALIN) 20 MG tablet    Sig: Take 1 tab PO in the afternoon    Dispense:  30 tablet    Refill:  0    Fill in 30 days   methylphenidate (RITALIN) 20 MG tablet    Sig: Take 1 tab PO in the afternoon.    Dispense:  30 tablet    Refill:  0    Fill in 60 days.   tirzepatide (ZEPBOUND) 2.5 MG/0.5ML Pen    Sig: Inject 2.5 mg into the skin once a week. Increase to 5mg  after 4 weeks    Dispense:  2 mL    Refill:  0   tirzepatide (ZEPBOUND) 5 MG/0.5ML Pen    Sig: Inject 5 mg into the skin once a week. Increase to 7.5mg  after 4 weeks    Dispense:  2 mL    Refill:  0   tirzepatide (ZEPBOUND) 7.5 MG/0.5ML Pen    Sig: Inject 7.5 mg into the skin once a week.    Dispense:  2 mL    Refill:  1    No follow-ups on file.    This visit occurred during the SARS-CoV-2 public health emergency.  Safety protocols were in place, including screening questions prior to the visit, additional usage of staff PPE, and extensive cleaning of exam room while observing appropriate contact time as indicated for disinfecting solutions.

## 2022-07-24 ENCOUNTER — Encounter: Payer: Self-pay | Admitting: Family Medicine

## 2022-07-27 MED ORDER — AMPHETAMINE-DEXTROAMPHETAMINE 20 MG PO TABS: ORAL_TABLET | ORAL | 0 refills | Status: DC

## 2022-07-28 ENCOUNTER — Encounter: Payer: Self-pay | Admitting: Family Medicine

## 2022-07-29 ENCOUNTER — Telehealth: Payer: Self-pay

## 2022-07-29 NOTE — Telephone Encounter (Addendum)
Initiated Prior authorization [id number] 2.5MG/0.5ML pen-injectors Via: Covermymeds Case/Key:[id number] Status: denied  as of 07/29/22 Reason:medication exclusion  Notified Pt via: Mychart

## 2022-08-14 ENCOUNTER — Ambulatory Visit: Payer: BC Managed Care – PPO | Admitting: Family Medicine

## 2022-09-03 ENCOUNTER — Other Ambulatory Visit: Payer: Self-pay | Admitting: Family Medicine

## 2022-09-03 MED ORDER — AMBULATORY NON FORMULARY MEDICATION: 1 refills | Status: DC

## 2022-09-07 ENCOUNTER — Other Ambulatory Visit: Payer: Self-pay | Admitting: Family Medicine

## 2022-09-07 MED ORDER — AMBULATORY NON FORMULARY MEDICATION: 1 refills | Status: DC

## 2022-11-27 ENCOUNTER — Encounter: Payer: Self-pay | Admitting: Family Medicine

## 2022-12-02 MED ORDER — AMBULATORY NON FORMULARY MEDICATION: 1 refills | Status: DC

## 2023-01-21 ENCOUNTER — Encounter: Payer: Self-pay | Admitting: Family Medicine

## 2023-01-21 ENCOUNTER — Ambulatory Visit: Payer: BC Managed Care – PPO | Admitting: Family Medicine

## 2023-01-21 VITALS — BP 126/81 | HR 86 | Ht 74.0 in | Wt 290.0 lb

## 2023-01-21 DIAGNOSIS — I10 Essential (primary) hypertension: Secondary | ICD-10-CM

## 2023-01-21 DIAGNOSIS — Z13220 Encounter for screening for lipoid disorders: Secondary | ICD-10-CM

## 2023-01-21 DIAGNOSIS — F902 Attention-deficit hyperactivity disorder, combined type: Secondary | ICD-10-CM

## 2023-01-21 MED ORDER — AMPHETAMINE-DEXTROAMPHETAMINE 20 MG PO TABS: 20.0000 mg | ORAL_TABLET | Freq: Every day | ORAL | 0 refills | Status: DC

## 2023-01-21 MED ORDER — LISDEXAMFETAMINE DIMESYLATE 40 MG PO CAPS: 40.0000 mg | ORAL_CAPSULE | ORAL | 0 refills | Status: DC

## 2023-01-21 NOTE — Assessment & Plan Note (Signed)
Doing well with current medications for management of ADHD symptoms including Vyvanse in the morning with an additional Adderall in the afternoon as needed.

## 2023-01-21 NOTE — Progress Notes (Signed)
Dillon Robinson - 37 y.o. male MRN [id number]  Date of birth: [birth year]  Subjective Chief Complaint  Patient presents with   Medication Refill    HPI Dillon Robinson is a 37 year old male here today for follow-up visit.  He feels like he is doing pretty well.  He is off on compounded semaglutide as he was having some increased GI symptoms as well as he felt that his weight has had a plateau even at maximum strength.  We have tried to get [facility] approved for him previously however were unable to do so as his insurance did not cover this.  Continues on Vyvanse with Adderall in the afternoon.  Tolerating this well at current strength.  He does feel that current medications are effective.  Blood pressure remains well-controlled off of antihypertensive medications.  ROS:  A comprehensive ROS was completed and negative except as noted per HPI  No Known Allergies  Past Medical History:  Diagnosis Date   Angioedema 10/21/2016   HTN (hypertension) 11/24/2016   OSA (obstructive sleep apnea) 11/24/2016   Vitamin D deficiency 10/21/2016    History reviewed. No pertinent surgical history.  Social History   Socioeconomic History   Marital status: Single    Spouse name: Not on file   Number of children: Not on file   Years of education: Not on file   Highest education level: Not on file  Occupational History   Not on file  Tobacco Use   Smoking status: Never   Smokeless tobacco: Never  Substance and Sexual Activity   Alcohol use: Yes   Drug use: No   Sexual activity: Not on file  Other Topics Concern   Not on file  Social History Narrative   Not on file   Social Determinants of Health   Financial Resource Strain: Not on file  Food Insecurity: Not on file  Transportation Needs: Not on file  Physical Activity: Not on file  Stress: Not on file  Social Connections: Unknown (11/09/2021)   [facility]   Social Network    Social Network: Not on file    Family  History  Problem Relation Age of Onset   Heart failure Father    Gout Father     Health Maintenance  Topic Date Due   DTaP/Tdap/Td (1 - Tdap) Never done   Hepatitis C Screening  02/12/2023 (Originally 11/05/2001)   HIV Screening  02/12/2023 (Originally 11/06/1998)   INFLUENZA VACCINE  02/04/2023   HPV VACCINES  Aged Out   COVID-19 Vaccine  Discontinued     ----------------------------------------------------------------------------------------------------------------------------------------------------------------------------------------------------------------- Physical Exam BP 126/81 (BP Location: Right Arm, Patient Position: Sitting, Cuff Size: Large)   Pulse 86   Ht 6\' 2"  (1.88 m)   Wt 290 lb (131.5 kg)   SpO2 95%   BMI 37.23 kg/m   Physical Exam Constitutional:      Appearance: Normal appearance.  HENT:     Head: Normocephalic and atraumatic.  Eyes:     General: No scleral icterus. Neurological:     General: No focal deficit present.     Mental Status: He is alert.  Psychiatric:        Mood and Affect: Mood normal.        Behavior: Behavior normal.     ------------------------------------------------------------------------------------------------------------------------------------------------------------------------------------------------------------------- Assessment and Plan  HTN (hypertension) Blood pressure is well-controlled off of medication at this time.  Morbid obesity ([facility]) He did have success with compounded GLP-1 however has discontinued this at this time.  Encouraged  continued dietary and exercise changes.  ADHD Doing well with current medications for management of ADHD symptoms including Vyvanse in the morning with an additional Adderall in the afternoon as needed.   Meds ordered this encounter  Medications   lisdexamfetamine (VYVANSE) 40 MG capsule    Sig: Take 1 capsule (40 mg total) by mouth every morning.    Dispense:  30 capsule     Refill:  0    Fill in 30 days   lisdexamfetamine (VYVANSE) 40 MG capsule    Sig: Take 1 capsule (40 mg total) by mouth every morning. Fill in 60 days    Dispense:  30 capsule    Refill:  0   lisdexamfetamine (VYVANSE) 40 MG capsule    Sig: Take 1 capsule (40 mg total) by mouth every morning. Fill now    Dispense:  30 capsule    Refill:  0   amphetamine-dextroamphetamine (ADDERALL) 20 MG tablet    Sig: Take 1 tablet (20 mg total) by mouth daily in the afternoon.    Dispense:  30 tablet    Refill:  0   amphetamine-dextroamphetamine (ADDERALL) 20 MG tablet    Sig: Take 1 tablet (20 mg total) by mouth daily in the afternoon.    Dispense:  30 tablet    Refill:  0   amphetamine-dextroamphetamine (ADDERALL) 20 MG tablet    Sig: Take 1 tablet (20 mg total) by mouth daily in the afternoon.    Dispense:  30 tablet    Refill:  0    Return in about 6 months (around 07/24/2023) for HTN/ADHD.    This visit occurred during the SARS-CoV-2 public health emergency.  Safety protocols were in place, including screening questions prior to the visit, additional usage of staff PPE, and extensive cleaning of exam room while observing appropriate contact time as indicated for disinfecting solutions.

## 2023-01-21 NOTE — Assessment & Plan Note (Signed)
Blood pressure is well-controlled off of medication at this time.

## 2023-01-21 NOTE — Assessment & Plan Note (Signed)
He did have success with compounded GLP-1 however has discontinued this at this time.  Encouraged continued dietary and exercise changes.

## 2023-01-25 ENCOUNTER — Other Ambulatory Visit: Payer: Self-pay | Admitting: Family Medicine

## 2023-01-26 LAB — COMPREHENSIVE METABOLIC PANEL
ALT: 30 IU/L (ref 0–44)
AST: 23 IU/L (ref 0–40)
Albumin: 4.5 g/dL (ref 4.1–5.1)
Alkaline Phosphatase: 59 IU/L (ref 44–121)
BUN/Creatinine Ratio: 11 (ref 9–20)
BUN: 15 mg/dL (ref 6–20)
Bilirubin Total: 0.5 mg/dL (ref 0.0–1.2)
CO2: 24 mmol/L (ref 20–29)
Calcium: 9.8 mg/dL (ref 8.7–10.2)
Chloride: 104 mmol/L (ref 96–106)
Creatinine, Ser: 1.39 mg/dL — ABNORMAL HIGH (ref 0.76–1.27)
Globulin, Total: 2.6 g/dL (ref 1.5–4.5)
Glucose: 109 mg/dL — ABNORMAL HIGH (ref 70–99)
Potassium: 4.8 mmol/L (ref 3.5–5.2)
Sodium: 144 mmol/L (ref 134–144)
Total Protein: 7.1 g/dL (ref 6.0–8.5)
eGFR: 66 mL/min/{1.73_m2} (ref >59)

## 2023-01-26 LAB — CBC WITH DIFFERENTIAL/PLATELET
Basophils Absolute: 0.1 10*3/uL (ref 0.0–0.2)
Basos: 1 %
EOS (ABSOLUTE): 0.1 10*3/uL (ref 0.0–0.4)
Eos: 2 %
Hematocrit: 45.6 % (ref 37.5–51.0)
Hemoglobin: 15 g/dL (ref 13.0–17.7)
Immature Grans (Abs): 0 10*3/uL (ref 0.0–0.1)
Immature Granulocytes: 0 %
Lymphocytes Absolute: 2.5 10*3/uL (ref 0.7–3.1)
Lymphs: 33 %
MCH: 28.1 pg (ref 26.6–33.0)
MCHC: 32.9 g/dL (ref 31.5–35.7)
MCV: 85 fL (ref 79–97)
Monocytes Absolute: 0.4 10*3/uL (ref 0.1–0.9)
Monocytes: 5 %
Neutrophils Absolute: 4.6 10*3/uL (ref 1.4–7.0)
Neutrophils: 59 %
Platelets: 193 10*3/uL (ref 150–450)
RBC: 5.34 x10E6/uL (ref 4.14–5.80)
RDW: 13.5 % (ref 11.6–15.4)
WBC: 7.8 10*3/uL (ref 3.4–10.8)

## 2023-01-26 LAB — LIPID PANEL WITH LDL/HDL RATIO
Cholesterol, Total: 178 mg/dL (ref 100–199)
HDL: 37 mg/dL — ABNORMAL LOW (ref >39)
LDL Chol Calc (NIH): 105 mg/dL — ABNORMAL HIGH (ref 0–99)
LDL/HDL Ratio: 2.8 ratio (ref 0.0–3.6)
Triglycerides: 205 mg/dL — ABNORMAL HIGH (ref 0–149)
VLDL Cholesterol Cal: 36 mg/dL (ref 5–40)

## 2023-01-26 LAB — TSH: TSH: 2.47 u[IU]/mL (ref 0.450–4.500)

## 2023-03-05 ENCOUNTER — Encounter: Payer: Self-pay | Admitting: Family Medicine

## 2023-07-26 ENCOUNTER — Ambulatory Visit: Payer: BC Managed Care – PPO | Admitting: Family Medicine

## 2023-07-26 ENCOUNTER — Encounter: Payer: Self-pay | Admitting: Family Medicine

## 2023-07-26 VITALS — BP 126/88 | HR 79 | Temp 98.2°F | Ht 74.0 in | Wt 300.1 lb

## 2023-07-26 DIAGNOSIS — G4733 Obstructive sleep apnea (adult) (pediatric): Secondary | ICD-10-CM

## 2023-07-26 DIAGNOSIS — F902 Attention-deficit hyperactivity disorder, combined type: Secondary | ICD-10-CM

## 2023-07-26 MED ORDER — LISDEXAMFETAMINE DIMESYLATE 40 MG PO CAPS: 40.0000 mg | ORAL_CAPSULE | ORAL | 0 refills | Status: DC

## 2023-07-26 MED ORDER — AMPHETAMINE-DEXTROAMPHETAMINE 20 MG PO TABS: 20.0000 mg | ORAL_TABLET | Freq: Every day | ORAL | 0 refills | Status: DC

## 2023-07-26 NOTE — Progress Notes (Signed)
Dillon Robinson - 38 y.o. male MRN [id number]  Date of birth: 09/30/1985  Subjective Chief Complaint  Patient presents with   Medical Management of Chronic Issues   Blood Pressure Check    HPI Dillon Robinson is a 38 y.o. male here today for follow up visit.   He reports that Vyvanse 40mg  with adderall 20mg  in the afternoon remains pretty effective for him.  He has not had any significant side effects with these.  BP remains well controlled without medication.  He is sleeping pretty well.    He looked into having weight loss surgery but ultimately decided against this. He is planning on working on lifestyle change again.   ROS:  A comprehensive ROS was completed and negative except as noted per HPI  No Known Allergies  Past Medical History:  Diagnosis Date   Angioedema 10/21/2016   HTN (hypertension) 11/24/2016   OSA (obstructive sleep apnea) 11/24/2016   Vitamin D deficiency 10/21/2016    No past surgical history on file.  Social History   Socioeconomic History   Marital status: Single    Spouse name: Not on file   Number of children: Not on file   Years of education: Not on file   Highest education level: Not on file  Occupational History   Not on file  Tobacco Use   Smoking status: Never   Smokeless tobacco: Never  Substance and Sexual Activity   Alcohol use: Yes   Drug use: No   Sexual activity: Not on file  Other Topics Concern   Not on file  Social History Narrative   Not on file   Social Drivers of Health   Financial Resource Strain: Patient Declined (04/27/2023)   Received from [facility]   Overall Financial Resource Strain (CARDIA)    Difficulty of Paying Living Expenses: Patient declined  Food Insecurity: Patient Declined (04/27/2023)   Received from [facility]   Hunger Vital Sign    Worried About Running Out of Food in the Last Year: Patient declined    Ran Out of Food in the Last Year: Patient declined  Transportation Needs: Patient Declined  (04/27/2023)   Received from [facility] - Transportation    Lack of Transportation (Medical): Patient declined    Lack of Transportation (Non-Medical): Patient declined  Physical Activity: Not on file  Stress: Not on file  Social Connections: Unknown (11/09/2021)   Received from [facility], [facility]   Social Network    Social Network: Not on file    Family History  Problem Relation Age of Onset   Heart failure Father    Gout Father     Health Maintenance  Topic Date Due   HIV Screening  Never done   Hepatitis C Screening  Never done   INFLUENZA VACCINE  10/04/2023 (Originally 02/04/2023)   DTaP/Tdap/Td (1 - Tdap) 07/25/2024 (Originally 11/06/2002)   HPV VACCINES  Aged Out   COVID-19 Vaccine  Discontinued     ----------------------------------------------------------------------------------------------------------------------------------------------------------------------------------------------------------------- Physical Exam BP 126/88 (BP Location: Left Arm, Patient Position: Sitting, Cuff Size: Large)   Pulse 79   Temp 98.2 F (36.8 C) (Oral)   Ht 6\' 2"  (1.88 m)   Wt (!) 300 lb 1.3 oz (136.1 kg)   SpO2 97%   BMI 38.53 kg/m   Physical Exam Constitutional:      Appearance: Normal appearance.  Eyes:     General: No scleral icterus. Cardiovascular:     Rate and Rhythm: Normal rate and  regular rhythm.  Pulmonary:     Effort: Pulmonary effort is normal.     Breath sounds: Normal breath sounds.  Neurological:     Mental Status: He is alert.  Psychiatric:        Mood and Affect: Mood normal.        Behavior: Behavior normal.     ------------------------------------------------------------------------------------------------------------------------------------------------------------------------------------------------------------------- Assessment and Plan  OSA (obstructive sleep apnea) I recommended that he check with insurance regarding  zepbound for management of his OSA.    ADHD Doing well with current medications for management of ADHD symptoms including Vyvanse in the morning with an additional Adderall in the afternoon as needed.   Meds ordered this encounter  Medications   amphetamine-dextroamphetamine (ADDERALL) 20 MG tablet    Sig: Take 1 tablet (20 mg total) by mouth daily in the afternoon.    Dispense:  30 tablet    Refill:  0   amphetamine-dextroamphetamine (ADDERALL) 20 MG tablet    Sig: Take 1 tablet (20 mg total) by mouth daily in the afternoon.    Dispense:  30 tablet    Refill:  0   amphetamine-dextroamphetamine (ADDERALL) 20 MG tablet    Sig: Take 1 tablet (20 mg total) by mouth daily in the afternoon.    Dispense:  30 tablet    Refill:  0   lisdexamfetamine (VYVANSE) 40 MG capsule    Sig: Take 1 capsule (40 mg total) by mouth every morning.    Dispense:  30 capsule    Refill:  0    Fill in 30 days   lisdexamfetamine (VYVANSE) 40 MG capsule    Sig: Take 1 capsule (40 mg total) by mouth every morning. Fill in 60 days    Dispense:  30 capsule    Refill:  0   lisdexamfetamine (VYVANSE) 40 MG capsule    Sig: Take 1 capsule (40 mg total) by mouth every morning. Fill now    Dispense:  30 capsule    Refill:  0    Return in about 6 months (around 01/23/2024) for ADHD.    This visit occurred during the SARS-CoV-2 public health emergency.  Safety protocols were in place, including screening questions prior to the visit, additional usage of staff PPE, and extensive cleaning of exam room while observing appropriate contact time as indicated for disinfecting solutions.

## 2023-07-26 NOTE — Assessment & Plan Note (Signed)
Doing well with current medications for management of ADHD symptoms including Vyvanse in the morning with an additional Adderall in the afternoon as needed.

## 2023-07-26 NOTE — Assessment & Plan Note (Signed)
I recommended that he check with insurance regarding zepbound for management of his OSA.

## 2023-11-03 ENCOUNTER — Encounter: Payer: Self-pay | Admitting: Family Medicine

## 2023-11-04 NOTE — Telephone Encounter (Signed)
 Patient scheduled for Monday 11/08/23.

## 2023-11-08 ENCOUNTER — Telehealth (INDEPENDENT_AMBULATORY_CARE_PROVIDER_SITE_OTHER): Admitting: Family Medicine

## 2023-11-08 ENCOUNTER — Encounter: Payer: Self-pay | Admitting: Family Medicine

## 2023-11-08 VITALS — Ht 74.0 in | Wt 300.0 lb

## 2023-11-08 DIAGNOSIS — G4733 Obstructive sleep apnea (adult) (pediatric): Secondary | ICD-10-CM | POA: Diagnosis not present

## 2023-11-08 MED ORDER — ZEPBOUND 5 MG/0.5ML ~~LOC~~ SOAJ: 5.0000 mg | SUBCUTANEOUS | 0 refills | Status: DC

## 2023-11-08 MED ORDER — ZEPBOUND 10 MG/0.5ML ~~LOC~~ SOAJ: 10.0000 mg | SUBCUTANEOUS | 0 refills | Status: DC

## 2023-11-08 MED ORDER — ZEPBOUND 2.5 MG/0.5ML ~~LOC~~ SOAJ: 2.5000 mg | SUBCUTANEOUS | 0 refills | Status: DC

## 2023-11-08 NOTE — Progress Notes (Signed)
 Dillon Robinson - 38 y.o. male MRN [id number]  Date of birth: [birth year]   All issues noted in this document were discussed and addressed.  No physical exam was performed (except for noted visual exam findings with Video Visits).  I discussed the limitations of evaluation and management by telemedicine and the availability of in person appointments. The patient expressed understanding and agreed to proceed.  I connected withNAME@ on 11/08/23 at  3:50 PM EDT by a video enabled telemedicine application and verified that I am speaking with the correct person using two identifiers.  Present at visit: Dillon Holter, DO Dillon Robinson   Patient Location: Home [address] Dillon Robinson [place] [address]   Provider location:   [facility]  Chief Complaint  Patient presents with   Sleep Apnea    HPI  Dillon Robinson is a 38 y.o. male who presents via audio/video conferencing for a telehealth visit today.  He would like to discuss additional options for management of sleep apnea.  Diagnosed with severe OSA in 2022 through Dr. Elvira Robinson office at [place]..  He has been prescribed CPAP therapy.  He has not been able to tolerate this very well.  He is interested in alternatives to CPAP.  Interested possibly in surgical device to help manage this.  He also wonders if Zepbound  may be approved.   ROS:  A comprehensive ROS was completed and negative except as noted per HPI  Past Medical History:  Diagnosis Date   Angioedema 10/21/2016   HTN (hypertension) 11/24/2016   OSA (obstructive sleep apnea) 11/24/2016   Vitamin D  deficiency 10/21/2016    History reviewed. No pertinent surgical history.  Family History  Problem Relation Age of Onset   Heart failure Father    Gout Father     Social History   Socioeconomic History   Marital status: Single    Spouse name: Not on file   Number of children: Not on file   Years of education: Not on file   Highest education level: Not on file  Occupational History    Not on file  Tobacco Use   Smoking status: Never   Smokeless tobacco: Never  Substance and Sexual Activity   Alcohol use: Yes   Drug use: No   Sexual activity: Not on file  Other Topics Concern   Not on file  Social History Narrative   Not on file   Social Drivers of Health   Financial Resource Strain: Patient Declined (04/27/2023)   Received from [facility]   Overall Financial Resource Strain (CARDIA)    Difficulty of Paying Living Expenses: Patient declined  Food Insecurity: Patient Declined (04/27/2023)   Received from [facility]   Hunger Vital Sign    Worried About Running Out of Food in the Last Year: Patient declined    Ran Out of Food in the Last Year: Patient declined  Transportation Needs: Patient Declined (04/27/2023)   Received from [facility] - Transportation    Lack of Transportation (Medical): Patient declined    Lack of Transportation (Non-Medical): Patient declined  Physical Activity: Not on file  Stress: Not on file  Social Connections: Unknown (11/09/2021)   Received from [facility], [facility]   Social Network    Social Network: Not on file  Intimate Partner Violence: Unknown (10/07/2021)   Received from [facility], [facility]   HITS    Physically Hurt: Not on file    Insult or Talk Down To: Not on file  Threaten Physical Harm: Not on file    Scream or Curse: Not on file     Current Outpatient Medications:    tirzepatide  (ZEPBOUND ) 10 MG/0.5ML Pen, Inject 10 mg into the skin once a week., Disp: 2 mL, Rfl: 0   tirzepatide  (ZEPBOUND ) 2.5 MG/0.5ML Pen, Inject 2.5 mg into the skin once a week., Disp: 2 mL, Rfl: 0   tirzepatide  (ZEPBOUND ) 5 MG/0.5ML Pen, Inject 5 mg into the skin once a week., Disp: 2 mL, Rfl: 0   AMBULATORY NON FORMULARY MEDICATION, Continuous positive airway pressure (CPAP) machine auto-titrate from 4-20 cm of H2O pressure, with all supplemental supplies as needed., Disp: 1 each, Rfl: 0    amphetamine -dextroamphetamine  (ADDERALL) 20 MG tablet, Take 1 tablet (20 mg total) by mouth daily in the afternoon., Disp: 30 tablet, Rfl: 0   amphetamine -dextroamphetamine  (ADDERALL) 20 MG tablet, Take 1 tablet (20 mg total) by mouth daily in the afternoon., Disp: 30 tablet, Rfl: 0   amphetamine -dextroamphetamine  (ADDERALL) 20 MG tablet, Take 1 tablet (20 mg total) by mouth daily in the afternoon., Disp: 30 tablet, Rfl: 0   lisdexamfetamine (VYVANSE ) 40 MG capsule, Take 1 capsule (40 mg total) by mouth every morning., Disp: 30 capsule, Rfl: 0   lisdexamfetamine (VYVANSE ) 40 MG capsule, Take 1 capsule (40 mg total) by mouth every morning. Fill in 60 days, Disp: 30 capsule, Rfl: 0   lisdexamfetamine (VYVANSE ) 40 MG capsule, Take 1 capsule (40 mg total) by mouth every morning. Fill now, Disp: 30 capsule, Rfl: 0   Misc. Devices MISC, Start AutoCPAP at 5-15 cm. water pressure.  AutoCPAP machine with a mask and supplies for severe OSA with AHI 32.  CPAP mask per patient preference.  Send to a local DME., Disp: , Rfl:   EXAM:  VITALS per patient if applicable: Ht 6\' 2"  (1.88 m)   Wt 300 lb (136.1 kg)   BMI 38.52 kg/m   GENERAL: alert, oriented, appears well and in no acute distress  HEENT: atraumatic, conjunttiva clear, no obvious abnormalities on inspection of external nose and ears  NECK: normal movements of the head and neck  LUNGS: on inspection no signs of respiratory distress, breathing rate appears normal, no obvious gross SOB, gasping or wheezing  CV: no obvious cyanosis  MS: moves all visible extremities without noticeable abnormality  PSYCH/NEURO: pleasant and cooperative, no obvious depression or anxiety, speech and thought processing grossly intact  ASSESSMENT AND PLAN:  Discussed the following assessment and plan:  OSA (obstructive sleep apnea) He has not been very tolerant of CPAP therapy.  Once again we discussed Zepbound  prescription and will see if he gets this approved  for sleep apnea.  He would like referral to ENT for evaluation for inspire device if Zepbound  cannot be approved.     I discussed the assessment and treatment plan with the patient. The patient was provided an opportunity to ask questions and all were answered. The patient agreed with the plan and demonstrated an understanding of the instructions.   The patient was advised to call back or seek an in-person evaluation if the symptoms worsen or if the condition fails to improve as anticipated.    Dillon Holter, DO

## 2023-11-08 NOTE — Assessment & Plan Note (Signed)
 He has not been very tolerant of CPAP therapy.  Once again we discussed Zepbound  prescription and will see if he gets this approved for sleep apnea.  He would like referral to ENT for evaluation for inspire device if Zepbound  cannot be approved.

## 2023-12-02 ENCOUNTER — Other Ambulatory Visit (HOSPITAL_COMMUNITY): Payer: Self-pay

## 2023-12-02 ENCOUNTER — Telehealth: Payer: Self-pay

## 2023-12-02 NOTE — Telephone Encounter (Signed)
 Pharmacy Patient Advocate Encounter   Received notification from Patient Advice Request messages that prior authorization for Zepbound  2.5MG /0.5ML pen-injectors is required/requested.   Insurance verification completed.   The patient is insured through [place] .   Per test claim: PA required; PA submitted to above mentioned insurance via CoverMyMeds Key/confirmation #/EOC [facility] Status is pending

## 2023-12-08 ENCOUNTER — Other Ambulatory Visit (HOSPITAL_COMMUNITY): Payer: Self-pay

## 2023-12-08 NOTE — Telephone Encounter (Signed)
 Pharmacy Patient Advocate Encounter  Received notification from [facility] [place] that Prior Authorization for Zepbound  2.5MG /0.5ML pen-injectors has been CANCELLED due to: The requested medication is not covered by [employer] and [facility]  for the member based on current plan benefits.   PA #/Case ID/Reference #: [id number]

## 2024-01-24 ENCOUNTER — Ambulatory Visit: Payer: BC Managed Care – PPO | Admitting: Family Medicine

## 2024-02-08 ENCOUNTER — Ambulatory Visit: Admitting: Family Medicine

## 2024-03-27 ENCOUNTER — Encounter: Payer: Self-pay | Admitting: Family Medicine

## 2024-03-27 ENCOUNTER — Ambulatory Visit: Admitting: Family Medicine

## 2024-03-27 VITALS — BP 118/79 | HR 87 | Ht 74.0 in | Wt 282.0 lb

## 2024-03-27 DIAGNOSIS — F902 Attention-deficit hyperactivity disorder, combined type: Secondary | ICD-10-CM

## 2024-03-27 DIAGNOSIS — I10 Essential (primary) hypertension: Secondary | ICD-10-CM | POA: Diagnosis not present

## 2024-03-27 MED ORDER — LISDEXAMFETAMINE DIMESYLATE 40 MG PO CAPS: 40.0000 mg | ORAL_CAPSULE | ORAL | 0 refills | Status: DC

## 2024-03-27 MED ORDER — LISDEXAMFETAMINE DIMESYLATE 40 MG PO CAPS: 40.0000 mg | ORAL_CAPSULE | ORAL | 0 refills | Status: AC

## 2024-03-27 MED ORDER — AMPHETAMINE-DEXTROAMPHETAMINE 20 MG PO TABS: 20.0000 mg | ORAL_TABLET | Freq: Every day | ORAL | 0 refills | Status: AC

## 2024-03-27 MED ORDER — AMPHETAMINE-DEXTROAMPHETAMINE 20 MG PO TABS: 20.0000 mg | ORAL_TABLET | Freq: Every day | ORAL | 0 refills | Status: DC

## 2024-03-27 NOTE — Assessment & Plan Note (Signed)
Blood pressure is well-controlled off of medication at this time.

## 2024-03-27 NOTE — Assessment & Plan Note (Signed)
Doing well with current medications for management of ADHD symptoms including Vyvanse in the morning with an additional Adderall in the afternoon as needed.

## 2024-03-27 NOTE — Progress Notes (Signed)
 Dillon Robinson - 38 y.o. male MRN [id number]  Date of birth: October 31, 1985  Subjective Chief Complaint  Patient presents with   Hypertension    HPI Dillon Robinson is a 38 y.o. male here today for follow up visit.   He reports that he is doing pretty well. SABRA   He continues with vyvanse  40mg  qam with additional adderall 20mg  in the afternoon.  He feels that this is still working well for him.  Denies  side effects at current strength.  He is sleeping well.  Weight is stable.   We did try to get zepbound  approved for weight loss, however, his insurance would not cover this.  He has continued to work on dietary changes.    ROS:  A comprehensive ROS was completed and negative except as noted per HPI  No Known Allergies  Past Medical History:  Diagnosis Date   Angioedema 10/21/2016   HTN (hypertension) 11/24/2016   OSA (obstructive sleep apnea) 11/24/2016   Vitamin D  deficiency 10/21/2016    No past surgical history on file.  Social History   Socioeconomic History   Marital status: Married    Spouse name: Not on file   Number of children: Not on file   Years of education: Not on file   Highest education level: Not on file  Occupational History   Not on file  Tobacco Use   Smoking status: Never   Smokeless tobacco: Never  Substance and Sexual Activity   Alcohol use: Yes   Drug use: No   Sexual activity: Not on file  Other Topics Concern   Not on file  Social History Narrative   Not on file   Social Drivers of Health   Financial Resource Strain: Patient Declined (04/27/2023)   Received from [facility]   Overall Financial Resource Strain (CARDIA)    Difficulty of Paying Living Expenses: Patient declined  Food Insecurity: Patient Declined (04/27/2023)   Received from [facility]   Hunger Vital Sign    Within the past 12 months, you worried that your food would run out before you got the money to buy more.: Patient declined    Within the past 12 months, the food you bought  just didn't last and you didn't have money to get more.: Patient declined  Transportation Needs: Patient Declined (04/27/2023)   Received from [facility] - Transportation    Lack of Transportation (Medical): Patient declined    Lack of Transportation (Non-Medical): Patient declined  Physical Activity: Not on file  Stress: Not on file  Social Connections: Unknown (11/09/2021)   Received from [facility]   Social Network    Social Network: Not on file    Family History  Problem Relation Age of Onset   Heart failure Father    Gout Father     Health Maintenance  Topic Date Due   HIV Screening  Never done   Hepatitis C Screening  Never done   DTaP/Tdap/Td (1 - Tdap) 07/25/2024 (Originally 11/06/2002)   Influenza Vaccine  10/03/2024 (Originally 02/04/2024)   Hepatitis B Vaccines 19-59 Average Risk (1 of 3 - 19+ 3-dose series) 03/27/2025 (Originally 11/06/2002)   HPV VACCINES (1 - 3-dose SCDM series) 03/27/2025 (Originally 11/06/2010)   Pneumococcal Vaccine  Aged Out   Meningococcal B Vaccine  Aged Out   COVID-19 Vaccine  Discontinued     ----------------------------------------------------------------------------------------------------------------------------------------------------------------------------------------------------------------- Physical Exam BP 118/79   Pulse 87   Ht 6' 2 (1.88 m)   Wt 282  lb (127.9 kg)   SpO2 98%   BMI 36.21 kg/m   Physical Exam Constitutional:      Appearance: Normal appearance.  Eyes:     General: No scleral icterus. Cardiovascular:     Rate and Rhythm: Normal rate and regular rhythm.  Pulmonary:     Effort: Pulmonary effort is normal.     Breath sounds: Normal breath sounds.  Neurological:     Mental Status: He is alert.  Psychiatric:        Mood and Affect: Mood normal.        Behavior: Behavior normal.      ------------------------------------------------------------------------------------------------------------------------------------------------------------------------------------------------------------------- Assessment and Plan  ADHD Doing well with current medications for management of ADHD symptoms including Vyvanse  in the morning with an additional Adderall in the afternoon as needed.  HTN (hypertension) Blood pressure is well-controlled off of medication at this time.   Meds ordered this encounter  Medications   DISCONTD: amphetamine -dextroamphetamine  (ADDERALL) 20 MG tablet    Sig: Take 1 tablet (20 mg total) by mouth daily in the afternoon.    Dispense:  30 tablet    Refill:  0    Fill now   DISCONTD: amphetamine -dextroamphetamine  (ADDERALL) 20 MG tablet    Sig: Take 1 tablet (20 mg total) by mouth daily in the afternoon.    Dispense:  30 tablet    Refill:  0    Fill in 30 days   DISCONTD: amphetamine -dextroamphetamine  (ADDERALL) 20 MG tablet    Sig: Take 1 tablet (20 mg total) by mouth daily in the afternoon.    Dispense:  30 tablet    Refill:  0    Fill in 60 days.   DISCONTD: lisdexamfetamine (VYVANSE ) 40 MG capsule    Sig: Take 1 capsule (40 mg total) by mouth every morning. Fill in 60 days    Dispense:  30 capsule    Refill:  0   DISCONTD: lisdexamfetamine (VYVANSE ) 40 MG capsule    Sig: Take 1 capsule (40 mg total) by mouth every morning. Fill now    Dispense:  30 capsule    Refill:  0   DISCONTD: lisdexamfetamine (VYVANSE ) 40 MG capsule    Sig: Take 1 capsule (40 mg total) by mouth every morning.    Dispense:  30 capsule    Refill:  0    Fill in 30 days   amphetamine -dextroamphetamine  (ADDERALL) 20 MG tablet    Sig: Take 1 tablet (20 mg total) by mouth daily in the afternoon.    Dispense:  30 tablet    Refill:  0    Fill in 30 days   amphetamine -dextroamphetamine  (ADDERALL) 20 MG tablet    Sig: Take 1 tablet (20 mg total) by mouth daily in the  afternoon.    Dispense:  30 tablet    Refill:  0    Fill in 60 days.   amphetamine -dextroamphetamine  (ADDERALL) 20 MG tablet    Sig: Take 1 tablet (20 mg total) by mouth daily in the afternoon.    Dispense:  30 tablet    Refill:  0    Fill now   lisdexamfetamine (VYVANSE ) 40 MG capsule    Sig: Take 1 capsule (40 mg total) by mouth every morning. Fill now    Dispense:  30 capsule    Refill:  0   lisdexamfetamine (VYVANSE ) 40 MG capsule    Sig: Take 1 capsule (40 mg total) by mouth every morning.    Dispense:  30 capsule  Refill:  0    Fill in 30 days   lisdexamfetamine (VYVANSE ) 40 MG capsule    Sig: Take 1 capsule (40 mg total) by mouth every morning. Fill in 60 days    Dispense:  30 capsule    Refill:  0    No follow-ups on file.

## 2024-06-12 ENCOUNTER — Encounter: Payer: Self-pay | Admitting: Family Medicine

## 2024-06-12 ENCOUNTER — Ambulatory Visit: Admitting: Family Medicine

## 2024-06-12 VITALS — BP 128/87 | HR 96 | Ht 74.0 in | Wt 277.0 lb

## 2024-06-12 DIAGNOSIS — K625 Hemorrhage of anus and rectum: Secondary | ICD-10-CM | POA: Diagnosis not present

## 2024-06-12 MED ORDER — HYDROCORTISONE ACETATE 25 MG RE SUPP: 25.0000 mg | Freq: Two times a day (BID) | RECTAL | 11 refills | Status: AC | PRN

## 2024-06-12 MED ORDER — LIDOCAINE (ANORECTAL) 5 % EX GEL: CUTANEOUS | 1 refills | Status: AC

## 2024-06-12 NOTE — Progress Notes (Signed)
 Dillon Robinson - 38 y.o. male MRN [id number]  Date of birth: 02/10/1986  Subjective Chief Complaint  Patient presents with   Hemorrhoids    HPI Dillon Robinson is a 38 y.o. male here today with complaint of possible hemorrhoids.  He has pain and bleeding in the rectal area.  He has tried topical OTC medication with occasional relief.  He has had this off and on for the past several months. His bowel movements are more loose.  He admits to not getting in enough fiber in his diet. He is drinking plenty of fluids.   ROS:  A comprehensive ROS was completed and negative except as noted per HPI  No Known Allergies  Past Medical History:  Diagnosis Date   Angioedema 10/21/2016   HTN (hypertension) 11/24/2016   OSA (obstructive sleep apnea) 11/24/2016   Vitamin D  deficiency 10/21/2016    History reviewed. No pertinent surgical history.  Social History   Socioeconomic History   Marital status: Married    Spouse name: Not on file   Number of children: Not on file   Years of education: Not on file   Highest education level: Not on file  Occupational History   Not on file  Tobacco Use   Smoking status: Never   Smokeless tobacco: Never  Substance and Sexual Activity   Alcohol use: Yes   Drug use: No   Sexual activity: Not on file  Other Topics Concern   Not on file  Social History Narrative   Not on file   Social Drivers of Health   Financial Resource Strain: Patient Declined (04/27/2023)   Received from [facility]   Overall Financial Resource Strain (CARDIA)    Difficulty of Paying Living Expenses: Patient declined  Food Insecurity: Patient Declined (04/27/2023)   Received [facility]   Hunger Vital Sign    Within the past 12 months, you worried that your food would run out before you got the money to buy more.: Patient declined    Within the past 12 months, the food you bought just didn't last and you didn't have money to get more.: Patient declined  Transportation Needs:  Patient Declined (04/27/2023)   Received from [facility] - Transportation    Lack of Transportation (Medical): Patient declined    Lack of Transportation (Non-Medical): Patient declined  Physical Activity: Not on file  Stress: Not on file  Social Connections: Not on file    Family History  Problem Relation Age of Onset   Heart failure Father    Gout Father     Health Maintenance  Topic Date Due   HIV Screening  Never done   Hepatitis C Screening  Never done   DTaP/Tdap/Td (1 - Tdap) 07/25/2024 (Originally 11/06/2002)   Influenza Vaccine  10/03/2024 (Originally 02/04/2024)   Hepatitis B Vaccines 19-59 Average Risk (1 of 3 - 19+ 3-dose series) 03/27/2025 (Originally 11/06/2002)   HPV VACCINES (1 - 3-dose SCDM series) 03/27/2025 (Originally 11/06/2010)   Pneumococcal Vaccine  Aged Out   Meningococcal B Vaccine  Aged Out   COVID-19 Vaccine  Discontinued     ----------------------------------------------------------------------------------------------------------------------------------------------------------------------------------------------------------------- Physical Exam BP 128/87 (BP Location: Left Arm, Patient Position: Sitting, Cuff Size: Large)   Pulse 96   Ht 6' 2 (1.88 m)   Wt 277 lb (125.6 kg)   SpO2 97%   BMI 35.56 kg/m   Physical Exam Constitutional:      Appearance: Normal appearance.  HENT:     Head:  Normocephalic and atraumatic.  Cardiovascular:     Rate and Rhythm: Normal rate and regular rhythm.  Pulmonary:     Effort: Pulmonary effort is normal.     Breath sounds: Normal breath sounds.  Genitourinary:    Comments: Small amount of dried blood around the anus.  No active bleeding noted. No external hemorrhoids noted.  Decline anoscopy.  Neurological:     Mental Status: He is alert.  Psychiatric:        Mood and Affect: Mood normal.        Behavior: Behavior normal.      ------------------------------------------------------------------------------------------------------------------------------------------------------------------------------------------------------------------- Assessment and Plan  Rectal bleeding Likely from internal hemorrhoid.  Adding anusol  suppository and lidocaine  as needed.  Discussed increasing fiber intake and keep fluid intake up.  Recommend Sitz bath and with hazel wipes for comfort. Referral placed to GI given recurrent issues.  If she does have internal hemorrhoids these may be amenable to banding. Red flags reviewed today.     Meds ordered this encounter  Medications   Lidocaine , Anorectal, 5 % GEL    Sig: Apply twice daily prn.    Dispense:  30 g    Refill:  1   hydrocortisone  (ANUSOL -HC) 25 MG suppository    Sig: Place 1 suppository (25 mg total) rectally 2 (two) times daily as needed for hemorrhoids.    Dispense:  24 suppository    Refill:  11    No follow-ups on file.

## 2024-06-12 NOTE — Patient Instructions (Signed)
 -Try anusol  suppository.  -Try lidocaine  gel as needed for pain.  -Witch hazel wipes can help with irritation.  -Increase fiber intake.  Fiber supplement such as benefiber can be helpful.    Hemorrhoids Hemorrhoids are swollen veins that may form: In the butt (rectum). These are called internal hemorrhoids. Around the opening of the butt (anus). These are called external hemorrhoids. Most hemorrhoids do not cause very bad problems. They often get better with changes to your lifestyle and what you eat. What are the causes? Having trouble pooping (constipation) or watery poop (diarrhea). Pushing too hard when you poop. Pregnancy. Being very overweight (obese). Sitting for too long. Riding a bike for a long time. Heavy lifting or other things that take a lot of effort. Anal sex. What are the signs or symptoms? Pain. Itching or soreness in the butt. Bleeding from the butt. Leaking poop. Swelling. One or more lumps around the opening of your butt. How is this treated? In most cases, hemorrhoids can be treated at home. You may be told to: Change what you eat. Make changes to your lifestyle. If these treatments do not help, you may need to have a procedure done. Your doctor may need to: Place rubber bands at the bottom of the hemorrhoids to make them fall off. Put medicine into the hemorrhoids to shrink them. Shine a type of light on the hemorrhoids to cause them to fall off. Do surgery to get rid of the hemorrhoids. Follow these instructions at home: Medicines Take over-the-counter and prescription medicines only as told by your doctor. Use creams with medicine in them or medicines that you put in your butt as told by your doctor. Eating and drinking  Eat foods that have a lot of fiber in them. These include whole grains, beans, nuts, fruits, and vegetables. Ask your doctor about taking products that have fiber added to them (fibersupplements). Take in less fat. You can do this  by: Eating low-fat dairy products. Eating less red meat. Staying away from processed foods. Drink enough fluid to keep your pee (urine) pale yellow. Managing pain and swelling  Take a warm-water bath (sitz bath) for 20 minutes to ease pain. Do this 3-4 times a day. You may do this in a bathtub. You may also use a portable sitz bath that fits over the toilet. If told, put ice on the painful area. It may help to use ice between your warm baths. Put ice in a plastic bag. Place a towel between your skin and the bag. Leave the ice on for 20 minutes, 2-3 times a day. If your skin turns bright red, take off the ice right away to prevent skin damage. The risk of damage is higher if you cannot feel pain, heat, or cold. General instructions Exercise. Ask your doctor how much and what kind of exercise is best for you. Go to the bathroom when you need to poop. Do not wait. Try not to push too hard when you poop. Keep your butt dry and clean. Use wet toilet paper or moist towelettes after you poop. Do not sit on the toilet for a long time. Contact a doctor if: You have pain and swelling that do not get better with treatment. You have trouble pooping. You cannot poop. You have pain or swelling outside the area of the hemorrhoids. Get help right away if: You have bleeding from the butt that will not stop. This information is not intended to replace advice given to you by your  health care provider. Make sure you discuss any questions you have with your health care provider. Document Revised: 03/04/2022 Document Reviewed: 03/04/2022 Elsevier Patient Education  2024 [employer].

## 2024-06-12 NOTE — Assessment & Plan Note (Signed)
 Likely from internal hemorrhoid.  Adding anusol  suppository and lidocaine  as needed.  Discussed increasing fiber intake and keep fluid intake up.  Recommend Sitz bath and with hazel wipes for comfort. Referral placed to GI given recurrent issues.  If she does have internal hemorrhoids these may be amenable to banding. Red flags reviewed today.

## 2024-09-25 ENCOUNTER — Ambulatory Visit: Admitting: Family Medicine
